# Patient Record
Sex: Male | Born: 1995
Health system: Southern US, Community
[De-identification: ages and names within clinical notes are randomized; demographics above are authoritative.]

## PROBLEM LIST (undated history)

## (undated) ENCOUNTER — Emergency Department (HOSPITAL_COMMUNITY): Payer: Medicaid Other | Source: Home / Self Care

## (undated) DIAGNOSIS — J45909 Unspecified asthma, uncomplicated: Secondary | ICD-10-CM

---

## 2014-05-31 ENCOUNTER — Encounter (HOSPITAL_BASED_OUTPATIENT_CLINIC_OR_DEPARTMENT_OTHER): Payer: Self-pay | Admitting: Emergency Medicine

## 2014-05-31 ENCOUNTER — Emergency Department (HOSPITAL_BASED_OUTPATIENT_CLINIC_OR_DEPARTMENT_OTHER)
Admission: EM | Admit: 2014-05-31 | Discharge: 2014-05-31 | Disposition: A | Discharge status: Home / Self Care | Payer: Medicaid Other | Attending: Emergency Medicine | Admitting: Emergency Medicine

## 2014-05-31 DIAGNOSIS — Y9389 Activity, other specified: Secondary | ICD-10-CM | POA: Insufficient documentation

## 2014-05-31 DIAGNOSIS — S61412A Laceration without foreign body of left hand, initial encounter: Secondary | ICD-10-CM | POA: Diagnosis present

## 2014-05-31 DIAGNOSIS — Y9289 Other specified places as the place of occurrence of the external cause: Secondary | ICD-10-CM | POA: Insufficient documentation

## 2014-05-31 DIAGNOSIS — Z720 Tobacco use: Secondary | ICD-10-CM | POA: Insufficient documentation

## 2014-05-31 DIAGNOSIS — Y998 Other external cause status: Secondary | ICD-10-CM | POA: Insufficient documentation

## 2014-05-31 DIAGNOSIS — IMO0002 Reserved for concepts with insufficient information to code with codable children: Secondary | ICD-10-CM

## 2014-05-31 DIAGNOSIS — W260XXA Contact with knife, initial encounter: Secondary | ICD-10-CM | POA: Diagnosis not present

## 2014-05-31 MED ORDER — LIDOCAINE-EPINEPHRINE (PF) 2 %-1:200000 IJ SOLN
10.0000 mL | Freq: Once | INTRAMUSCULAR | Status: AC
  Administered 2014-05-31: 10 mL
  Filled 2014-05-31: qty 20

## 2014-05-31 NOTE — ED Notes (Signed)
Pt has laceration to left hand from knife. [place] in hand and pt walked into knife

## 2014-05-31 NOTE — ED Provider Notes (Signed)
CSN: [id number]     Arrival date & time 05/31/14  2210 History  This chart was scribed for Gilda Creasehristopher J. Pollina, * by Roxy Cedarhandni Bhalodia, ED Scribe. This patient was seen in room MH09/MH09 and the patient's care was started at 10:24 PM.   Chief Complaint  Patient presents with  . Laceration   Patient is a 18 y.o. male presenting with skin laceration. The history is provided by the patient. No language interpreter was used.  Laceration  HPI Comments: Darren Finley is a 18 y.o. male who presents to the Emergency Department complaining of laceration to left hand that occurred prior to arrival when patient states to have accidentally ran into a knife in the kitchen.  History reviewed. No pertinent past medical history. History reviewed. No pertinent past surgical history. No family history on file. History  Substance Use Topics  . Smoking status: Current Every Day Smoker  . Smokeless tobacco: Not on file  . Alcohol Use: No   Review of Systems  Skin: Positive for wound.       Laceration to left hand  All other systems reviewed and are negative.  Allergies  Review of patient's allergies indicates no known allergies.  Home Medications   Prior to Admission medications   Not on File   Triage Vitals: BP 146/76 mmHg  Pulse 89  Temp(Src) 98.6 F (37 C) (Oral)  Resp 18  Ht 6\' 5"  (1.956 m)  Wt 250 lb (113.399 kg)  BMI 29.64 kg/m2  SpO2 99%  Physical Exam  Constitutional: He is oriented to person, place, and time. He appears well-developed and well-nourished. No distress.  HENT:  Head: Normocephalic and atraumatic.  Right Ear: Hearing normal.  Left Ear: Hearing normal.  Nose: Nose normal.  Mouth/Throat: Oropharynx is clear and moist and mucous membranes are normal.  Eyes: Conjunctivae and EOM are normal. Pupils are equal, round, and reactive to light.  Neck: Normal range of motion. Neck supple.  Cardiovascular: Regular rhythm, S1 normal and S2 normal.  Exam reveals no gallop  and no friction rub.   No murmur heard. Pulmonary/Chest: Effort normal and breath sounds normal. No respiratory distress. He exhibits no tenderness.  Abdominal: Soft. Normal appearance and bowel sounds are normal. There is no hepatosplenomegaly. There is no tenderness. There is no rebound, no guarding, no tenderness at McBurney's point and negative Murphy's sign. No hernia.  Musculoskeletal: Normal range of motion.  Neurological: He is alert and oriented to person, place, and time. He has normal strength. No cranial nerve deficit or sensory deficit. Coordination normal. GCS eye subscore is 4. GCS verbal subscore is 5. GCS motor subscore is 6.  Skin: Skin is warm, dry and intact. No rash noted. No cyanosis.  2cm linear laceration over the lateral aspect of thumb.  Psychiatric: He has a normal mood and affect. His speech is normal and behavior is normal. Thought content normal.  Nursing note and vitals reviewed.   ED Course  Procedures (including critical care time)  LACERATION REPAIR Performed by: Gilda CreasePOLLINA, CHRISTOPHER J. Authorized by: Gilda CreasePOLLINA, CHRISTOPHER J. Consent: Verbal consent obtained. Risks and benefits: risks, benefits and alternatives were discussed Consent given by: patient Patient identity confirmed: provided demographic data Prepped and Draped in normal sterile fashion Wound explored  Laceration Location: left hand  Laceration Length: 2cm  No Foreign Bodies seen or palpated  Anesthesia: local infiltration  Local anesthetic: lidocaine 2% with epinephrine  Anesthetic total: 2 ml  Irrigation method: syringe Amount of cleaning: standard  Skin  closure: sutures  Number of sutures: 3  Technique: simple interrupted, 3-0 prolene  Patient tolerance: Patient tolerated the procedure well with no immediate complications.   DIAGNOSTIC STUDIES: Oxygen Saturation is 99% on RA, normal by my interpretation.    COORDINATION OF CARE: 10:27 PM- Discussed plans to apply  sutures to affected area. Pt advised of plan for treatment and pt agrees.  Labs Review Labs Reviewed - No data to display  Imaging Review No results found.   EKG Interpretation None     MDM   Final diagnoses:  None  Laceration   for evaluation of laceration on the left hand at the thenar eminence. Patient has a linear laceration that is through the dermis into the subcutaneous fat, did not reach muscle. No concern for neurovascular injury. Sutures placed. Suture removal in 10 days.  I personally performed the services described in this documentation, which was scribed in my presence. The recorded information has been reviewed and is accurate.  Gilda Creasehristopher J. Pollina, MD 05/31/14 2255

## 2014-05-31 NOTE — Discharge Instructions (Signed)
Sutures need to be removed in 10 days. See student health, urgent care, primary care doctor or return to the ER.  Laceration Care, Adult A laceration is a cut or lesion that goes through all layers of the skin and into the tissue just beneath the skin. TREATMENT  Some lacerations may not require closure. Some lacerations may not be able to be closed due to an increased risk of infection. It is important to see your caregiver as soon as possible after an injury to minimize the risk of infection and maximize the opportunity for successful closure. If closure is appropriate, pain medicines may be given, if needed. The wound will be cleaned to help prevent infection. Your caregiver will use stitches (sutures), staples, wound glue (adhesive), or skin adhesive strips to repair the laceration. These tools bring the skin edges together to allow for faster healing and a better cosmetic outcome. However, all wounds will heal with a scar. Once the wound has healed, scarring can be minimized by covering the wound with sunscreen during the day for 1 full year. HOME CARE INSTRUCTIONS  For sutures or staples:  Keep the wound clean and dry.  If you were given a bandage (dressing), you should change it at least once a day. Also, change the dressing if it becomes wet or dirty, or as directed by your caregiver.  Wash the wound with soap and water 2 times a day. Rinse the wound off with water to remove all soap. Pat the wound dry with a clean towel.  After cleaning, apply a thin layer of the antibiotic ointment as recommended by your caregiver. This will help prevent infection and keep the dressing from sticking.  You may shower as usual after the first 24 hours. Do not soak the wound in water until the sutures are removed.  Only take over-the-counter or prescription medicines for pain, discomfort, or fever as directed by your caregiver.  Get your sutures or staples removed as directed by your caregiver. For skin  adhesive strips:  Keep the wound clean and dry.  Do not get the skin adhesive strips wet. You may bathe carefully, using caution to keep the wound dry.  If the wound gets wet, pat it dry with a clean towel.  Skin adhesive strips will fall off on their own. You may trim the strips as the wound heals. Do not remove skin adhesive strips that are still stuck to the wound. They will fall off in time. For wound adhesive:  You may briefly wet your wound in the shower or bath. Do not soak or scrub the wound. Do not swim. Avoid periods of heavy perspiration until the skin adhesive has fallen off on its own. After showering or bathing, gently pat the wound dry with a clean towel.  Do not apply liquid medicine, cream medicine, or ointment medicine to your wound while the skin adhesive is in place. This may loosen the film before your wound is healed.  If a dressing is placed over the wound, be careful not to apply tape directly over the skin adhesive. This may cause the adhesive to be pulled off before the wound is healed.  Avoid prolonged exposure to sunlight or tanning lamps while the skin adhesive is in place. Exposure to ultraviolet light in the first year will darken the scar.  The skin adhesive will usually remain in place for 5 to 10 days, then naturally fall off the skin. Do not pick at the adhesive film. You may need  a tetanus shot if:  You cannot remember when you had your last tetanus shot.  You have never had a tetanus shot. If you get a tetanus shot, your arm may swell, get red, and feel warm to the touch. This is common and not a problem. If you need a tetanus shot and you choose not to have one, there is a rare chance of getting tetanus. Sickness from tetanus can be serious. SEEK MEDICAL CARE IF:   You have redness, swelling, or increasing pain in the wound.  You see a red line that goes away from the wound.  You have yellowish-white fluid (pus) coming from the wound.  You have  a fever.  You notice a bad smell coming from the wound or dressing.  Your wound breaks open before or after sutures have been removed.  You notice something coming out of the wound such as wood or glass.  Your wound is on your hand or foot and you cannot move a finger or toe. SEEK IMMEDIATE MEDICAL CARE IF:   Your pain is not controlled with prescribed medicine.  You have severe swelling around the wound causing pain and numbness or a change in color in your arm, hand, leg, or foot.  Your wound splits open and starts bleeding.  You have worsening numbness, weakness, or loss of function of any joint around or beyond the wound.  You develop painful lumps near the wound or on the skin anywhere on your body. MAKE SURE YOU:   Understand these instructions.  Will watch your condition.  Will get help right away if you are not doing well or get worse. Document Released: 07/10/2005 Document Revised: 10/02/2011 Document Reviewed: 01/03/2011 [facility] Patient Information 2015 [place], [place]. This information is not intended to replace advice given to you by your health care provider. Make sure you discuss any questions you have with your health care provider.

## 2014-06-10 ENCOUNTER — Encounter (HOSPITAL_BASED_OUTPATIENT_CLINIC_OR_DEPARTMENT_OTHER): Payer: Self-pay

## 2014-06-10 ENCOUNTER — Emergency Department (HOSPITAL_BASED_OUTPATIENT_CLINIC_OR_DEPARTMENT_OTHER)
Admission: EM | Admit: 2014-06-10 | Discharge: 2014-06-10 | Disposition: A | Discharge status: Home / Self Care | Payer: Medicaid Other | Attending: Emergency Medicine | Admitting: Emergency Medicine

## 2014-06-10 DIAGNOSIS — Z87891 Personal history of nicotine dependence: Secondary | ICD-10-CM | POA: Diagnosis not present

## 2014-06-10 DIAGNOSIS — Z4802 Encounter for removal of sutures: Secondary | ICD-10-CM | POA: Diagnosis not present

## 2014-06-10 NOTE — ED Notes (Signed)
For suture removal left hand

## 2014-06-10 NOTE — ED Provider Notes (Signed)
CSN: [id number]     Arrival date & time 06/10/14  1724 History   First MD Initiated Contact with Patient 06/10/14 1733     Chief Complaint  Patient presents with  . Suture / Staple Removal     (Consider location/radiation/quality/duration/timing/severity/associated sxs/prior Treatment) The history is provided by the patient.   Darren Finley is a 18 y.o. male who presents to the ED for suture removal of the left hand. Sutures were placed 10 days ago. He states no problems other than he is having a little numbness in his thumb. He states the cut was really deep.   History reviewed. No pertinent past medical history. History reviewed. No pertinent past surgical history. No family history on file. History  Substance Use Topics  . Smoking status: Former Games developermoker  . Smokeless tobacco: Not on file  . Alcohol Use: No    Review of Systems Negative except as stated in HPI   Allergies  Review of patient's allergies indicates no known allergies.  Home Medications   Prior to Admission medications   Medication Sig Start Date End Date Taking? Authorizing Provider  Lisdexamfetamine Dimesylate (VYVANSE PO) Take by mouth.   Yes Historical Provider, MD   There were no vitals taken for this visit. Physical Exam  Constitutional: He is oriented to person, place, and time. He appears well-developed and well-nourished.  HENT:  Head: Normocephalic.  Eyes: EOM are normal.  Neck: Neck supple.  Cardiovascular: Normal rate.   Pulmonary/Chest: Effort normal.  Musculoskeletal: Normal range of motion.  Sutures in place, x3,  dorsum of right hand at base of thumb. Healing without signs of infection.   Neurological: He is alert and oriented to person, place, and time. No cranial nerve deficit.  Skin: Skin is warm and dry.  Psychiatric: He has a normal mood and affect. His behavior is normal.  Nursing note and vitals reviewed.   ED Course  Procedures  Sutures removed by me.  Wound  cleaned Steri strips applied  MDM  18 y.o. male here for suture removal. I discussed with the patient that if he continues to have numbness in his thumb that he is to call Dr. Pearletha ForgeHudnall for follow up. He voices understanding and agrees with plan.      Lauderdale LakesHope M Haitham Dolinsky, NP 06/10/14 1750  Audree CamelScott T Goldston, MD 06/17/14 [phone]

## 2014-06-10 NOTE — Discharge Instructions (Signed)

## 2014-12-31 ENCOUNTER — Emergency Department (HOSPITAL_BASED_OUTPATIENT_CLINIC_OR_DEPARTMENT_OTHER)
Admission: EM | Admit: 2014-12-31 | Discharge: 2014-12-31 | Disposition: A | Discharge status: Home / Self Care | Payer: Medicaid Other | Attending: Emergency Medicine | Admitting: Emergency Medicine

## 2014-12-31 ENCOUNTER — Encounter (HOSPITAL_BASED_OUTPATIENT_CLINIC_OR_DEPARTMENT_OTHER): Payer: Self-pay

## 2014-12-31 DIAGNOSIS — M5489 Other dorsalgia: Secondary | ICD-10-CM

## 2014-12-31 DIAGNOSIS — J45909 Unspecified asthma, uncomplicated: Secondary | ICD-10-CM | POA: Diagnosis not present

## 2014-12-31 DIAGNOSIS — R350 Frequency of micturition: Secondary | ICD-10-CM | POA: Diagnosis not present

## 2014-12-31 DIAGNOSIS — Z87891 Personal history of nicotine dependence: Secondary | ICD-10-CM | POA: Insufficient documentation

## 2014-12-31 DIAGNOSIS — R300 Dysuria: Secondary | ICD-10-CM | POA: Insufficient documentation

## 2014-12-31 DIAGNOSIS — M545 Low back pain: Secondary | ICD-10-CM | POA: Diagnosis present

## 2014-12-31 DIAGNOSIS — R3915 Urgency of urination: Secondary | ICD-10-CM | POA: Insufficient documentation

## 2014-12-31 HISTORY — DX: Unspecified asthma, uncomplicated: J45.909

## 2014-12-31 LAB — URINE MICROSCOPIC-ADD ON

## 2014-12-31 LAB — URINALYSIS, ROUTINE W REFLEX MICROSCOPIC
Bilirubin Urine: NEGATIVE
Glucose, UA: NEGATIVE mg/dL
Hgb urine dipstick: NEGATIVE
Ketones, ur: NEGATIVE mg/dL
Nitrite: NEGATIVE
PH: 7 (ref 5.0–8.0)
Protein, ur: NEGATIVE mg/dL
SPECIFIC GRAVITY, URINE: 1.025 (ref 1.005–1.030)
Urobilinogen, UA: 1 mg/dL (ref 0.0–1.0)

## 2014-12-31 MED ORDER — IBUPROFEN 800 MG PO TABS
800.0000 mg | ORAL_TABLET | Freq: Three times a day (TID) | ORAL | Status: DC | PRN
Start: 2014-12-31 — End: 2015-03-12

## 2014-12-31 MED ORDER — TRAMADOL HCL 50 MG PO TABS: 50.0000 mg | ORAL_TABLET | Freq: Four times a day (QID) | ORAL | Status: DC | PRN

## 2014-12-31 MED ORDER — IBUPROFEN 800 MG PO TABS
800.0000 mg | ORAL_TABLET | Freq: Once | ORAL | Status: AC
  Administered 2014-12-31: 800 mg via ORAL
  Filled 2014-12-31: qty 1

## 2014-12-31 NOTE — ED Notes (Signed)
C/o dysuria and lower back pain x 4 weeks

## 2014-12-31 NOTE — Discharge Instructions (Signed)

## 2014-12-31 NOTE — ED Notes (Signed)
MD at bedside. 

## 2014-12-31 NOTE — ED Provider Notes (Signed)
TIME SEEN: 4:30 PM  CHIEF COMPLAINT: Lower back pain, dysuria, urinary frequency  HPI: Pt is a 19 y.o. male with history of asthma who presents to the emergency department with 4 weeks of dysuria, urinary frequency and urgency. One week later he developed lower back pain. Reports the back pain radiates down his left leg. Worse with movement, bending over and standing for long periods of time. States he did research on the Internet and thinks he may have a urinary tract infection. Has never had a UTI before. Denies penile discharge. No testicular pain or swelling. No fevers, nausea, vomiting or diarrhea. No numbness, tingling or focal weakness. No bowel or bladder incontinence. No urinary retention. No known history of injury to the back but does workout regularly. Is sexually active with one male partner and uses condoms. Denies any history of STDs previously.  ROS: See HPI Constitutional: no fever  Eyes: no drainage  ENT: no runny nose   Cardiovascular:  no chest pain  Resp: no SOB  GI: no vomiting GU:  dysuria Integumentary: no rash  Allergy: no hives  Musculoskeletal: no leg swelling  Neurological: no slurred speech ROS otherwise negative  PAST MEDICAL HISTORY/PAST SURGICAL HISTORY:  Past Medical History  Diagnosis Date  . Asthma     MEDICATIONS:  Prior to Admission medications   Not on File    ALLERGIES:  No Known Allergies  SOCIAL HISTORY:  History  Substance Use Topics  . Smoking status: Former Games developer  . Smokeless tobacco: Not on file  . Alcohol Use: No    FAMILY HISTORY: No family history on file.  EXAM: BP 130/87 mmHg  Pulse 92  Temp(Src) 98.4 F (36.9 C) (Oral)  Resp 18  Ht  (1.956 m)  Wt 255 lb (115.667 kg)  BMI 30.23 kg/m2  SpO2 98% CONSTITUTIONAL: Alert and oriented and responds appropriately to questions. Well-appearing; well-nourished, nontoxic, smiling, pleasant, afebrile, in no distress  HEAD: Normocephalic EYES: Conjunctivae clear,  PERRL ENT: normal nose; no rhinorrhea; moist mucous membranes; pharynx without lesions noted NECK: Supple, no meningismus, no LAD  CARD: RRR; S1 and S2 appreciated; no murmurs, no clicks, no rubs, no gallops RESP: Normal chest excursion without splinting or tachypnea; breath sounds clear and equal bilaterally; no wheezes, no rhonchi, no rales, no hypoxia or respiratory distress, speaking full sentences ABD/GI: Normal bowel sounds; non-distended; soft, non-tender, no rebound, no guarding, no peritoneal signs GU:  Normal external genitalia, circumcised male, normal urethral meatus without blood or discharge, no testicular tenderness or masses, no scrotal masses, 2+ femoral pulses bilaterally, no hernia appreciated BACK:  The back appears normal and is non-tender to palpation, there is no CVA tenderness, no midline spinal tenderness or step-off or deformity EXT: Normal ROM in all joints; non-tender to palpation; no edema; normal capillary refill; no cyanosis, no calf tenderness or swelling    SKIN: Normal color for age and race; warm NEURO: Moves all extremities equally, sensation to light touch intact diffusely, cranial nerves II through XII intact, normal gait PSYCH: The patient's mood and manner are appropriate. Grooming and personal hygiene are appropriate.  MEDICAL DECISION MAKING: Patient here with urinary symptoms and lower back pain. Urine today shows trace leukocytes but no hematuria or other sign of infection. Gonorrhea and chlamydia pending. He has not tried any medication at home for his pain. Pain is likely musculoskeletal in nature given his worse with movement, stretching. He may have some symptoms of radiculopathy given intermittent pain down his left leg  but none currently. We'll discharge him with ibuprofen and short prescription for tramadol if that is not enough to control his pain. Have advised rest, alternating ice and heat for pain control. Discussed return precautions. He has no red  psych symptoms that make me think that he needs emergent imaging today. Have her committed close outpatient follow-up if symptoms continue despite medical management. He verbalizes understanding and is comfortable with plan.      Layla Maw Ward, DO 12/31/14 1713

## 2015-01-01 LAB — GC/CHLAMYDIA PROBE AMP (~~LOC~~) NOT AT ARMC
Chlamydia: POSITIVE — AB
Neisseria Gonorrhea: NEGATIVE

## 2015-01-04 ENCOUNTER — Telehealth (HOSPITAL_COMMUNITY): Payer: Self-pay

## 2015-01-04 NOTE — ED Notes (Signed)
Positive for chlamydia. Chart sent to EDP office for review.  

## 2015-01-05 ENCOUNTER — Telehealth: Payer: Self-pay | Admitting: Emergency Medicine

## 2015-01-05 NOTE — Telephone Encounter (Signed)
Post ED Visit - Positive Culture Follow-up: Successful Patient Follow-Up  Positive Chlamydia culture  [x]  Patient discharged without antimicrobial prescription and treatment is now indicated []  Organism is resistant to prescribed ED discharge antimicrobial []  Patient with positive blood cultures  Changes discussed with ED provider: Trixie Dredge PA New antibiotic prescription: Azithromycin  One gram PO x once Called to [facility] [phone]  Contacted patient, date 01/05/15 , time 1400  ID verified, patient notified of positive Chlamydia and need for treatment. RX Azithromycin called to [facility] [phone]. STD instructions provided, patient verbalized understanding.  Jiles Harold 01/05/2015, 2:06 PM

## 2015-03-12 ENCOUNTER — Emergency Department (HOSPITAL_BASED_OUTPATIENT_CLINIC_OR_DEPARTMENT_OTHER)
Admission: EM | Admit: 2015-03-12 | Discharge: 2015-03-12 | Disposition: A | Discharge status: Home / Self Care | Payer: Medicaid Other | Attending: Emergency Medicine | Admitting: Emergency Medicine

## 2015-03-12 ENCOUNTER — Encounter (HOSPITAL_BASED_OUTPATIENT_CLINIC_OR_DEPARTMENT_OTHER): Payer: Self-pay | Admitting: *Deleted

## 2015-03-12 DIAGNOSIS — R062 Wheezing: Secondary | ICD-10-CM | POA: Diagnosis present

## 2015-03-12 DIAGNOSIS — Z113 Encounter for screening for infections with a predominantly sexual mode of transmission: Secondary | ICD-10-CM | POA: Diagnosis not present

## 2015-03-12 DIAGNOSIS — Z87891 Personal history of nicotine dependence: Secondary | ICD-10-CM | POA: Insufficient documentation

## 2015-03-12 DIAGNOSIS — J4521 Mild intermittent asthma with (acute) exacerbation: Secondary | ICD-10-CM | POA: Diagnosis not present

## 2015-03-12 DIAGNOSIS — J4520 Mild intermittent asthma, uncomplicated: Secondary | ICD-10-CM

## 2015-03-12 LAB — URINE MICROSCOPIC-ADD ON

## 2015-03-12 LAB — URINALYSIS, ROUTINE W REFLEX MICROSCOPIC
Bilirubin Urine: NEGATIVE
GLUCOSE, UA: NEGATIVE mg/dL
HGB URINE DIPSTICK: NEGATIVE
Ketones, ur: NEGATIVE mg/dL
Nitrite: NEGATIVE
PH: 7.5 (ref 5.0–8.0)
Protein, ur: NEGATIVE mg/dL
SPECIFIC GRAVITY, URINE: 1.017 (ref 1.005–1.030)
Urobilinogen, UA: 1 mg/dL (ref 0.0–1.0)

## 2015-03-12 MED ORDER — CEFTRIAXONE SODIUM 250 MG IJ SOLR
250.0000 mg | Freq: Once | INTRAMUSCULAR | Status: AC
  Administered 2015-03-12: 250 mg via INTRAMUSCULAR
  Filled 2015-03-12: qty 250

## 2015-03-12 MED ORDER — ALBUTEROL SULFATE (2.5 MG/3ML) 0.083% IN NEBU
5.0000 mg | INHALATION_SOLUTION | Freq: Once | RESPIRATORY_TRACT | Status: AC
  Administered 2015-03-12: 5 mg via RESPIRATORY_TRACT
  Filled 2015-03-12: qty 6

## 2015-03-12 MED ORDER — AZITHROMYCIN 250 MG PO TABS
1000.0000 mg | ORAL_TABLET | Freq: Once | ORAL | Status: AC
  Administered 2015-03-12: 1000 mg via ORAL
  Filled 2015-03-12: qty 4

## 2015-03-12 MED ORDER — ALBUTEROL SULFATE HFA 108 (90 BASE) MCG/ACT IN AERS
1.0000 | INHALATION_SPRAY | RESPIRATORY_TRACT | Status: DC | PRN
  Administered 2015-03-12: 1 via RESPIRATORY_TRACT
  Filled 2015-03-12: qty 6.7

## 2015-03-12 NOTE — ED Provider Notes (Signed)
CSN: [id number]     Arrival date & time 03/12/15  1804 History   First MD Initiated Contact with Patient 03/12/15 1814     Chief Complaint  Patient presents with  . Wheezing     (Consider location/radiation/quality/duration/timing/severity/associated sxs/prior Treatment) Patient is a 19 y.o. male presenting with wheezing. The history is provided by the patient and medical records.  Wheezing Associated symptoms: chest tightness and shortness of breath      18 y.o. M with hx of asthma, presenting to the ED for wheezing for the past 3 days. Patient denies known trigger exposure. He states symptoms have been intermittent.  He reports some chest tightness and sensation of shortness of breath. He denies any cough, nasal congestion, or other upper respiratory symptoms. No fever or chills. Patient does not have any medications at home for his asthma.  Past Medical History  Diagnosis Date  . Asthma    History reviewed. No pertinent past surgical history. History reviewed. No pertinent family history. Social History  Substance Use Topics  . Smoking status: Former Games developer  . Smokeless tobacco: None  . Alcohol Use: No    Review of Systems  Respiratory: Positive for chest tightness, shortness of breath and wheezing.   All other systems reviewed and are negative.     Allergies  Review of patient's allergies indicates no known allergies.  Home Medications   Prior to Admission medications   Not on File   BP 150/66 mmHg  Pulse 88  Temp(Src) 98.2 F (36.8 C) (Oral)  Resp 16  Ht 6\' 5"  (1.956 m)  Wt 250 lb (113.399 kg)  BMI 29.64 kg/m2  SpO2 100%   Physical Exam  Constitutional: He is oriented to person, place, and time. He appears well-developed and well-nourished. No distress.  HENT:  Head: Normocephalic and atraumatic.  Mouth/Throat: Oropharynx is clear and moist.  Eyes: Conjunctivae and EOM are normal. Pupils are equal, round, and reactive to light.  Neck: Normal range of  motion.  Cardiovascular: Normal rate, regular rhythm and normal heart sounds.   Pulmonary/Chest: Effort normal and breath sounds normal. No respiratory distress. He has no wheezes. He has no rhonchi. He has no rales.  Respirations unlabored, lungs clear bilaterally, speaking in full complete sentences without difficulty; O2 sats 100% on room air  Abdominal: Soft. Bowel sounds are normal. There is no tenderness. There is no guarding.  Musculoskeletal: Normal range of motion.  Neurological: He is alert and oriented to person, place, and time.  Skin: Skin is warm and dry. He is not diaphoretic.  Psychiatric: He has a normal mood and affect.  Nursing note and vitals reviewed.   ED Course  Procedures (including critical care time) Labs Review Labs Reviewed  URINALYSIS, ROUTINE W REFLEX MICROSCOPIC (NOT AT [facility]) - Abnormal; Notable for the following:    Leukocytes, UA SMALL (*)    All other components within normal limits  URINE MICROSCOPIC-ADD ON  GC/CHLAMYDIA PROBE AMP ([place]) NOT AT [facility]    Imaging Review No results found.    EKG Interpretation None      MDM   Final diagnoses:  Asthma, mild intermittent, uncomplicated  Screening for STD (sexually transmitted disease)   19 year old male here with wheezing. His vital signs are stable on room air. He is in no acute respiratory distress. His lungs are completely clear bilaterally and he is speaking in full complete sentences without difficulty. Patient still complains of some shortness of breath. Nebulizer treatment was given as well  as an albuterol inhaler. Patient informed respiratory therapist that he has concerns for recurrent STD and would like to be tested. He denies any current dysuria or penile discharge. He was diagnosed with chlamydia 2 months ago. He has had recent unprotected sex with new sexual partner. UA with small leukocytes, otherwise normal. Gc/chl pending.  Patient treated empirically with Rocephin and  azithromycin. He was instructed that he will be notified in the next 24-48 hrs if culture results are positive.  FU with health dept for further STD needs.  Discussed plan with patient, he/she acknowledged understanding and agreed with plan of care.  Return precautions given for new or worsening symptoms.  Garlon Hatchet, PA-C 03/12/15 2122  Rolan Bucco, MD 03/12/15 2130

## 2015-03-12 NOTE — ED Notes (Signed)
Pt reports wheezing from asthma x 2-3 days.  No respiratory distress noted.

## 2015-03-12 NOTE — Discharge Instructions (Signed)
He is inhaler as needed for shortness of breath or wheezing. Follow-up with the health department for further STD needs. Return here for new concerns.

## 2015-03-15 LAB — GC/CHLAMYDIA PROBE AMP (~~LOC~~) NOT AT ARMC
Chlamydia: POSITIVE — AB
Neisseria Gonorrhea: NEGATIVE

## 2015-03-16 ENCOUNTER — Telehealth (HOSPITAL_COMMUNITY): Payer: Self-pay | Admitting: Emergency Medicine

## 2015-03-16 NOTE — Telephone Encounter (Signed)
pt returned call, ID verified x three. Patient notified of positive Chlamydia and that treatment was given while in ED. STD instructions provided, patient verbalized understanding.

## 2015-03-16 NOTE — Telephone Encounter (Signed)
Positive Chlamydia culture Treated with Rocephin and Zithromax per protocol MD [facility] faxed  Will contact patient. 

## 2015-11-09 ENCOUNTER — Emergency Department (HOSPITAL_BASED_OUTPATIENT_CLINIC_OR_DEPARTMENT_OTHER)
Admission: EM | Admit: 2015-11-09 | Discharge: 2015-11-09 | Disposition: A | Discharge status: Home / Self Care | Payer: Medicaid Other | Attending: Emergency Medicine | Admitting: Emergency Medicine

## 2015-11-09 ENCOUNTER — Encounter (HOSPITAL_BASED_OUTPATIENT_CLINIC_OR_DEPARTMENT_OTHER): Payer: Self-pay

## 2015-11-09 ENCOUNTER — Emergency Department (HOSPITAL_BASED_OUTPATIENT_CLINIC_OR_DEPARTMENT_OTHER): Payer: Medicaid Other

## 2015-11-09 DIAGNOSIS — F17200 Nicotine dependence, unspecified, uncomplicated: Secondary | ICD-10-CM | POA: Diagnosis not present

## 2015-11-09 DIAGNOSIS — R062 Wheezing: Secondary | ICD-10-CM | POA: Diagnosis present

## 2015-11-09 DIAGNOSIS — J45901 Unspecified asthma with (acute) exacerbation: Secondary | ICD-10-CM

## 2015-11-09 DIAGNOSIS — Z720 Tobacco use: Secondary | ICD-10-CM

## 2015-11-09 MED ORDER — IPRATROPIUM-ALBUTEROL 0.5-2.5 (3) MG/3ML IN SOLN
3.0000 mL | Freq: Once | RESPIRATORY_TRACT | Status: AC
  Administered 2015-11-09: 3 mL via RESPIRATORY_TRACT
  Filled 2015-11-09: qty 3

## 2015-11-09 MED ORDER — ALBUTEROL SULFATE (2.5 MG/3ML) 0.083% IN NEBU
5.0000 mg | INHALATION_SOLUTION | Freq: Once | RESPIRATORY_TRACT | Status: AC
  Administered 2015-11-09: 5 mg via RESPIRATORY_TRACT
  Filled 2015-11-09: qty 6

## 2015-11-09 MED ORDER — ALBUTEROL SULFATE HFA 108 (90 BASE) MCG/ACT IN AERS: 1.0000 | INHALATION_SPRAY | RESPIRATORY_TRACT | Status: DC | PRN

## 2015-11-09 MED ORDER — PREDNISONE 10 MG (21) PO TBPK: 10.0000 mg | ORAL_TABLET | Freq: Every day | ORAL | Status: DC

## 2015-11-09 MED ORDER — ALBUTEROL SULFATE HFA 108 (90 BASE) MCG/ACT IN AERS: 1.0000 | INHALATION_SPRAY | Freq: Four times a day (QID) | RESPIRATORY_TRACT | Status: DC | PRN

## 2015-11-09 MED ORDER — ALBUTEROL SULFATE (2.5 MG/3ML) 0.083% IN NEBU
2.5000 mg | INHALATION_SOLUTION | Freq: Once | RESPIRATORY_TRACT | Status: AC
  Administered 2015-11-09: 2.5 mg via RESPIRATORY_TRACT
  Filled 2015-11-09: qty 3

## 2015-11-09 MED ORDER — PREDNISONE 10 MG PO TABS
60.0000 mg | ORAL_TABLET | Freq: Once | ORAL | Status: AC
  Administered 2015-11-09: 60 mg via ORAL
  Filled 2015-11-09: qty 1

## 2015-11-09 MED FILL — predniSONE 10 MG TABS: 10 | 12 days supply | Qty: 42 | Fill #0

## 2015-11-09 MED FILL — PROVENTIL HFA 90 MCG INH: 108 (90 BAS | 30 days supply | Qty: 7 | Fill #0

## 2015-11-09 NOTE — ED Provider Notes (Signed)
CSN: [id number]     Arrival date & time 11/09/15  1210 History   First MD Initiated Contact with Patient 11/09/15 1213     Chief Complaint  Patient presents with  . Wheezing     (Consider location/radiation/quality/duration/timing/severity/associated sxs/prior Treatment) HPI   PT HAS A HX OF ASTHMA AND HAS HAD COLD SX FOR THE PAST FEW DAYS.  THE PT IS OUT OF HIS INHALER.  HE QUIT SMOKING 3 DAYS AGO.  Past Medical History  Diagnosis Date  . Asthma    History reviewed. No pertinent past surgical history. No family history on file. Social History  Substance Use Topics  . Smoking status: Former Smoker    Quit date: 11/06/2015  . Smokeless tobacco: None  . Alcohol Use: No    Review of Systems  HENT: Positive for congestion.   Respiratory: Positive for cough, shortness of breath and wheezing.   All other systems reviewed and are negative.     Allergies  Review of patient's allergies indicates no known allergies.  Home Medications   Prior to Admission medications   Not on File   BP 125/75 mmHg  Pulse 67  Temp(Src) 97.9 F (36.6 C) (Oral)  Resp 20  Ht 6\' 5"  (1.956 m)  Wt 255 lb (115.667 kg)  BMI 30.23 kg/m2  SpO2 100% Physical Exam  Constitutional: He is oriented to person, place, and time. He appears well-developed and well-nourished.  HENT:  Head: Normocephalic and atraumatic.  Mouth/Throat: Oropharynx is clear and moist.  Eyes: Conjunctivae and EOM are normal. Pupils are equal, round, and reactive to light.  Neck: Normal range of motion. Neck supple.  Cardiovascular: Normal rate, regular rhythm, normal heart sounds and intact distal pulses.   Pulmonary/Chest: He has wheezes.  Abdominal: Soft. Bowel sounds are normal.  Musculoskeletal: Normal range of motion.  Neurological: He is alert and oriented to person, place, and time.  Skin: Skin is warm and dry.  Psychiatric: He has a normal mood and affect.  Nursing note and vitals reviewed.   ED Course   Procedures (including critical care time) Labs Review Labs Reviewed - No data to display  Imaging Review No results found. I have personally reviewed and evaluated these images and lab results as part of my medical decision-making.   EKG Interpretation None      MDM  IMPROVED  FINAL DX: ACUTE ASTHMA EXACERBATION TOBACCO ABUSE      Jacalyn LefevreJulie Mearle Drew, MD 11/09/15 1340

## 2015-11-09 NOTE — ED Notes (Signed)
C/o wheezing, sob x 1 week-NAD

## 2015-11-09 NOTE — Discharge Instructions (Signed)
Asthma, Adult  CONTINUE TO NOT SMOKE  Asthma is a condition of the lungs in which the airways tighten and narrow. Asthma can make it hard to breathe. Asthma cannot be cured, but medicine and lifestyle changes can help control it. Asthma may be started (triggered) by:  Animal skin flakes (dander).  Dust.  Cockroaches.  Pollen.  Mold.  Smoke.  Cleaning products.  Hair sprays or aerosol sprays.  Paint fumes or strong smells.  Cold air, weather changes, and winds.  Crying or laughing hard.  Stress.  Certain medicines or drugs.  Foods, such as dried fruit, potato chips, and sparkling grape juice.  Infections or conditions (colds, flu).  Exercise.  Certain medical conditions or diseases.  Exercise or tiring activities. HOME CARE   Take medicine as told by your doctor.  Use a peak flow meter as told by your doctor. A peak flow meter is a tool that measures how well the lungs are working.  Record and keep track of the peak flow meter's readings.  Understand and use the asthma action plan. An asthma action plan is a written plan for taking care of your asthma and treating your attacks.  To help prevent asthma attacks:  Do not smoke. Stay away from secondhand smoke.  Change your heating and air conditioning filter often.  Limit your use of fireplaces and wood stoves.  Get rid of pests (such as roaches and mice) and their droppings.  Throw away plants if you see mold on them.  Clean your floors. Dust regularly. Use cleaning products that do not smell.  Have someone vacuum when you are not home. Use a vacuum cleaner with a HEPA filter if possible.  Replace carpet with wood, tile, or vinyl flooring. Carpet can trap animal skin flakes and dust.  Use allergy-proof pillows, mattress covers, and box spring covers.  Wash bed sheets and blankets every week in hot water and dry them in a dryer.  Use blankets that are made of polyester or cotton.  Clean bathrooms  and kitchens with bleach. If possible, have someone repaint the walls in these rooms with mold-resistant paint. Keep out of the rooms that are being cleaned and painted.  Wash hands often. GET HELP IF:  You have make a whistling sound when breaking (wheeze), have shortness of breath, or have a cough even if taking medicine to prevent attacks.  The colored mucus you cough up (sputum) is thicker than usual.  The colored mucus you cough up changes from clear or white to yellow, green, gray, or bloody.  You have problems from the medicine you are taking such as:  A rash.  Itching.  Swelling.  Trouble breathing.  You need reliever medicines more than 2-3 times a week.  Your peak flow measurement is still at 50-79% of your personal best after following the action plan for 1 hour.  You have a fever. GET HELP RIGHT AWAY IF:   You seem to be worse and are not responding to medicine during an asthma attack.  You are short of breath even at rest.  You get short of breath when doing very little activity.  You have trouble eating, drinking, or talking.  You have chest pain.  You have a fast heartbeat.  Your lips or fingernails start to turn blue.  You are light-headed, dizzy, or faint.  Your peak flow is less than 50% of your personal best.   This information is not intended to replace advice given to you by  your health care provider. Make sure you discuss any questions you have with your health care provider.   Document Released: 12/27/2007 Document Revised: 03/31/2015 Document Reviewed: 02/06/2013 Elsevier Interactive Patient Education [employer]

## 2015-12-03 ENCOUNTER — Emergency Department (HOSPITAL_BASED_OUTPATIENT_CLINIC_OR_DEPARTMENT_OTHER): Payer: Medicaid Other

## 2015-12-03 ENCOUNTER — Encounter (HOSPITAL_BASED_OUTPATIENT_CLINIC_OR_DEPARTMENT_OTHER): Payer: Self-pay | Admitting: Emergency Medicine

## 2015-12-03 ENCOUNTER — Emergency Department (HOSPITAL_BASED_OUTPATIENT_CLINIC_OR_DEPARTMENT_OTHER)
Admission: EM | Admit: 2015-12-03 | Discharge: 2015-12-04 | Disposition: A | Discharge status: Home / Self Care | Payer: Medicaid Other | Attending: Emergency Medicine | Admitting: Emergency Medicine

## 2015-12-03 DIAGNOSIS — J45901 Unspecified asthma with (acute) exacerbation: Secondary | ICD-10-CM

## 2015-12-03 DIAGNOSIS — R0602 Shortness of breath: Secondary | ICD-10-CM | POA: Diagnosis present

## 2015-12-03 DIAGNOSIS — Z87891 Personal history of nicotine dependence: Secondary | ICD-10-CM | POA: Diagnosis not present

## 2015-12-03 DIAGNOSIS — R062 Wheezing: Secondary | ICD-10-CM

## 2015-12-03 MED ORDER — ALBUTEROL SULFATE (2.5 MG/3ML) 0.083% IN NEBU
2.5000 mg | INHALATION_SOLUTION | Freq: Once | RESPIRATORY_TRACT | Status: AC
Start: 2015-12-03 — End: 2015-12-03
  Administered 2015-12-03: 2.5 mg via RESPIRATORY_TRACT
  Filled 2015-12-03: qty 3

## 2015-12-03 MED ORDER — ALBUTEROL (5 MG/ML) CONTINUOUS INHALATION SOLN
10.0000 mg/h | INHALATION_SOLUTION | RESPIRATORY_TRACT | Status: DC
  Administered 2015-12-03: 10 mg/h via RESPIRATORY_TRACT
  Filled 2015-12-03: qty 20

## 2015-12-03 MED ORDER — METHYLPREDNISOLONE SODIUM SUCC 125 MG IJ SOLR
125.0000 mg | Freq: Once | INTRAMUSCULAR | Status: AC
  Administered 2015-12-04: 125 mg via INTRAVENOUS
  Filled 2015-12-03: qty 2

## 2015-12-03 MED ORDER — IPRATROPIUM-ALBUTEROL 0.5-2.5 (3) MG/3ML IN SOLN
3.0000 mL | Freq: Once | RESPIRATORY_TRACT | Status: AC
  Administered 2015-12-03: 3 mL via RESPIRATORY_TRACT
  Filled 2015-12-03: qty 3

## 2015-12-03 NOTE — ED Notes (Signed)
Patient states that he has had SOB for 2 days. The patient was told by PCP to continue his prednisone and use n haler when he saw her this am

## 2015-12-04 MED ORDER — PREDNISONE 10 MG PO TABS: 20.0000 mg | ORAL_TABLET | Freq: Every day | ORAL | Status: DC

## 2015-12-04 MED ORDER — ALBUTEROL (5 MG/ML) CONTINUOUS INHALATION SOLN
20.0000 mg/h | INHALATION_SOLUTION | RESPIRATORY_TRACT | Status: DC
  Administered 2015-12-04: 20 mg/h via RESPIRATORY_TRACT

## 2015-12-04 MED ORDER — ALBUTEROL SULFATE HFA 108 (90 BASE) MCG/ACT IN AERS: 2.0000 | INHALATION_SPRAY | RESPIRATORY_TRACT | Status: DC | PRN

## 2015-12-04 MED ORDER — IPRATROPIUM BROMIDE 0.02 % IN SOLN
1.0000 mg | Freq: Once | RESPIRATORY_TRACT | Status: AC
  Administered 2015-12-04: 1 mg via RESPIRATORY_TRACT
  Filled 2015-12-04: qty 5

## 2015-12-04 MED ORDER — MAGNESIUM SULFATE 50 % IJ SOLN
2.0000 g | Freq: Once | INTRAMUSCULAR | Status: AC
  Administered 2015-12-04: 2 g via INTRAVENOUS
  Filled 2015-12-04: qty 4

## 2015-12-04 NOTE — ED Provider Notes (Signed)
CSN: [id number]     Arrival date & time 12/03/15  2311 History   First MD Initiated Contact with Patient 12/03/15 2329     Chief Complaint  Patient presents with  . Shortness of Breath    (Consider location/radiation/quality/duration/timing/severity/associated sxs/prior Treatment) Patient is a 20 y.o. male presenting with shortness of breath. The history is provided by the patient and medical records. No language interpreter was used.  Shortness of Breath Associated symptoms: cough and wheezing   Associated symptoms: no abdominal pain, no fever, no headaches and no rash    Zachary GeorgeMohamed Sutcliffe is a 20 y.o. male  with a PMH of asthma who presents to the Emergency Department complaining of shortness of breath which began suddenly upon awakening this morning. Home inhalers used at home with minimal relief. He was seen by his primary physician this afternoon around 4:00. He was given a breathing treatment, stating it improved his symptoms temporarily, and sent home. He was told to take prednisone that he had left over at home, however he did not take this prior to arrival today. Patient states symptoms worsened, and he felt like he was unable to take a big breath, therefore he came to the ED today. Denies chest pain, fever. Has been experiencing cold symptoms over the last few days as well. Patient states he does not have asthma exacerbations very often and this is much worse than exacerbations he has experienced in the past. Hospitalized as a child for asthma "maybe once" Seen in ED for similar on 4/18 and was non-compliant with meds.    Past Medical History  Diagnosis Date  . Asthma    History reviewed. No pertinent past surgical history. History reviewed. No pertinent family history. Social History  Substance Use Topics  . Smoking status: Former Smoker    Quit date: 11/06/2015  . Smokeless tobacco: None  . Alcohol Use: No    Review of Systems  Constitutional: Negative for fever.  HENT:  Positive for congestion.   Eyes: Negative for visual disturbance.  Respiratory: Positive for cough, shortness of breath and wheezing.   Cardiovascular: Negative.   Gastrointestinal: Negative for abdominal pain.  Musculoskeletal: Negative for back pain.  Skin: Negative for rash.  Allergic/Immunologic: Negative for immunocompromised state.  Neurological: Negative for headaches.      Allergies  Review of patient's allergies indicates no known allergies.  Home Medications   Prior to Admission medications   Medication Sig Start Date End Date Taking? Authorizing Provider  albuterol (PROVENTIL HFA;VENTOLIN HFA) 108 (90 Base) MCG/ACT inhaler Inhale 1-2 puffs into the lungs every 6 (six) hours as needed for wheezing or shortness of breath. 11/09/15   Jacalyn LefevreJulie Haviland, MD  predniSONE (STERAPRED UNI-PAK 21 TAB) 10 MG (21) TBPK tablet Take 1 tablet (10 mg total) by mouth daily. Take 6 tabs by mouth daily  for 2 days, then 5 tabs for 2 days, then 4 tabs for 2 days, then 3 tabs for 2 days, 2 tabs for 2 days, then 1 tab by mouth daily for 2 days 11/09/15   Jacalyn LefevreJulie Haviland, MD   BP 128/72 mmHg  Pulse 93  Temp(Src) 98.1 F (36.7 C) (Oral)  Resp 28  Wt 115.667 kg  SpO2 97% Physical Exam  Constitutional: He is oriented to person, place, and time. He appears well-developed and well-nourished.  HENT:  Head: Normocephalic and atraumatic.  Cardiovascular: Normal rate, regular rhythm and normal heart sounds.   No murmur heard. Pulmonary/Chest:  Increased effort of breathing, prolonged expiratory  phase with wheezing in all lung fields.  Abdominal: Soft. He exhibits no distension. There is no tenderness.  Musculoskeletal: He exhibits no edema.  Neurological: He is alert and oriented to person, place, and time.  Skin: Skin is warm and dry.  Nursing note and vitals reviewed.   ED Course  Procedures (including critical care time) Labs Review Labs Reviewed - No data to display  Imaging Review Dg  Chest 2 View  12/04/2015  CLINICAL DATA:  Dyspnea for 2 days EXAM: CHEST  2 VIEW COMPARISON:  [place] 17 FINDINGS: The lungs are clear. The pulmonary vasculature is normal. Heart size is normal. Hilar and mediastinal contours are unremarkable. There is no pleural effusion. IMPRESSION: No active cardiopulmonary disease. Electronically Signed   By: Ellery Plunk M.D.   On: 12/04/2015 00:03   I have personally reviewed and evaluated these images and lab results as part of my medical decision-making.   EKG Interpretation None      MDM   Final diagnoses:  Wheezing   Riely Baskett presents to ED for asthma exacerbation. DuoNeb given upon arrival. Patient placed on continuous neb and treated with Solu-Medrol and mag. Chest x-ray with no cute cardiopulmonary disease. Patient still receiving neb treatment at shift change. Care assumed by oncoming provider, Dr. Elesa Massed, case discussed, plan agreed upon. Will continue to monitor and dispo appropriately.   Filed Vitals:   12/03/15 2320  BP: 128/72  Pulse: 93  Temp: 98.1 F (36.7 C)  Resp: [id number] Quintarius Ferns, PA-C 12/04/15 0119  Layla Maw Venise Ellingwood, DO 12/04/15 [phone]

## 2015-12-04 NOTE — Discharge Instructions (Signed)

## 2016-06-12 ENCOUNTER — Emergency Department (HOSPITAL_BASED_OUTPATIENT_CLINIC_OR_DEPARTMENT_OTHER)
Admission: EM | Admit: 2016-06-12 | Discharge: 2016-06-12 | Disposition: A | Discharge status: Home / Self Care | Payer: Medicaid Other | Attending: Emergency Medicine | Admitting: Emergency Medicine

## 2016-06-12 ENCOUNTER — Encounter (HOSPITAL_BASED_OUTPATIENT_CLINIC_OR_DEPARTMENT_OTHER): Payer: Self-pay | Admitting: *Deleted

## 2016-06-12 ENCOUNTER — Emergency Department (HOSPITAL_BASED_OUTPATIENT_CLINIC_OR_DEPARTMENT_OTHER): Payer: Medicaid Other

## 2016-06-12 DIAGNOSIS — J189 Pneumonia, unspecified organism: Secondary | ICD-10-CM | POA: Diagnosis not present

## 2016-06-12 DIAGNOSIS — J4521 Mild intermittent asthma with (acute) exacerbation: Secondary | ICD-10-CM | POA: Diagnosis not present

## 2016-06-12 DIAGNOSIS — R0602 Shortness of breath: Secondary | ICD-10-CM | POA: Diagnosis present

## 2016-06-12 DIAGNOSIS — Z87891 Personal history of nicotine dependence: Secondary | ICD-10-CM | POA: Insufficient documentation

## 2016-06-12 LAB — RAPID STREP SCREEN (MED CTR MEBANE ONLY): Streptococcus, Group A Screen (Direct): NEGATIVE

## 2016-06-12 MED ORDER — ALBUTEROL SULFATE (2.5 MG/3ML) 0.083% IN NEBU
5.0000 mg | INHALATION_SOLUTION | Freq: Once | RESPIRATORY_TRACT | Status: AC
  Administered 2016-06-12: 5 mg via RESPIRATORY_TRACT
  Filled 2016-06-12: qty 6

## 2016-06-12 MED ORDER — ALBUTEROL SULFATE HFA 108 (90 BASE) MCG/ACT IN AERS: 1.0000 | INHALATION_SPRAY | Freq: Four times a day (QID) | RESPIRATORY_TRACT | 0 refills | Status: DC | PRN

## 2016-06-12 MED ORDER — PREDNISONE 20 MG PO TABS: ORAL_TABLET | ORAL | 0 refills | Status: DC

## 2016-06-12 MED ORDER — IPRATROPIUM-ALBUTEROL 0.5-2.5 (3) MG/3ML IN SOLN
RESPIRATORY_TRACT | Status: AC
  Administered 2016-06-12: 3 mL
  Filled 2016-06-12: qty 3

## 2016-06-12 MED ORDER — PREDNISONE 50 MG PO TABS
60.0000 mg | ORAL_TABLET | Freq: Once | ORAL | Status: AC
  Administered 2016-06-12: 60 mg via ORAL
  Filled 2016-06-12: qty 1

## 2016-06-12 MED ORDER — AZITHROMYCIN 250 MG PO TABS: ORAL_TABLET | ORAL | 0 refills | Status: DC

## 2016-06-12 MED ORDER — ALBUTEROL SULFATE (2.5 MG/3ML) 0.083% IN NEBU
INHALATION_SOLUTION | RESPIRATORY_TRACT | Status: AC
Start: 2016-06-12 — End: 2016-06-12
  Administered 2016-06-12: 2.5 mg
  Filled 2016-06-12: qty 3

## 2016-06-12 MED FILL — AZITHROMYCIN 250 MG TABLET: 250 | 5 days supply | Qty: 6 | Fill #0

## 2016-06-12 MED FILL — predniSONE 20 MG TABS: 20 | 6 days supply | Qty: 12 | Fill #0

## 2016-06-12 NOTE — ED Provider Notes (Signed)
[facility]   CSN: [id number] Arrival date & time: 06/12/16  1058     History   Chief Complaint Chief Complaint  Patient presents with  . Shortness of Breath    HPI Darren Finley is a 20 y.o. male hx of asthma here with cough, wheezing. Patient has coughing for the last 2 weeks. Has been using albuterol with minimal relief. Has sinus congestion and runny nose and sore throat as well. Denies fevers. Patient ran out of his albuterol yesterday. Has seasonal allergies and exacerbations several times a year. No recent admissions.   The history is provided by the patient.    Past Medical History:  Diagnosis Date  . Asthma     There are no active problems to display for this patient.   History reviewed. No pertinent surgical history.     Home Medications    Prior to Admission medications   Medication Sig Start Date End Date Taking? Authorizing Provider  albuterol (PROVENTIL HFA;VENTOLIN HFA) 108 (90 Base) MCG/ACT inhaler Inhale 1-2 puffs into the lungs every 6 (six) hours as needed for wheezing or shortness of breath. 11/09/15   Jacalyn LefevreJulie Haviland, MD  albuterol (PROVENTIL HFA;VENTOLIN HFA) 108 (90 Base) MCG/ACT inhaler Inhale 2 puffs into the lungs every 4 (four) hours as needed for wheezing or shortness of breath. 12/04/15   Kristen N Ward, DO  predniSONE (DELTASONE) 10 MG tablet Take 2 tablets (20 mg total) by mouth daily. 12/04/15   Layla MawKristen N Ward, DO    Family History No family history on file.  Social History Social History  Substance Use Topics  . Smoking status: Former Smoker    Quit date: 11/06/2015  . Smokeless tobacco: Never Used  . Alcohol use No     Allergies   Patient has no known allergies.   Review of Systems Review of Systems  Respiratory: Positive for cough and shortness of breath.   All other systems reviewed and are negative.    Physical Exam Updated Vital Signs BP 134/95   Pulse 81   Temp 97.7 F (36.5 C)  (Oral)   Resp 20   Ht 6\' 6"  (1.981 m)   Wt 255 lb (115.7 kg)   SpO2 99%   BMI 29.47 kg/m   Physical Exam  Constitutional: He is oriented to person, place, and time.  Coughing   HENT:  Head: Normocephalic.  OP slightly red, tonsils not enlarged   Eyes: EOM are normal. Pupils are equal, round, and reactive to light.  Neck: Normal range of motion. Neck supple.  Cardiovascular: Normal rate, regular rhythm and normal heart sounds.   Pulmonary/Chest:  Slightly tachypneic, + diffuse wheezing, worse on L side. No retractions.   Abdominal: Soft. Bowel sounds are normal. He exhibits no distension. There is no tenderness. There is no guarding.  Musculoskeletal: Normal range of motion.  Neurological: He is alert and oriented to person, place, and time.  Skin: Skin is warm.  Psychiatric: He has a normal mood and affect.  Nursing note and vitals reviewed.    ED Treatments / Results  Labs (all labs ordered are listed, but only abnormal results are displayed) Labs Reviewed  RAPID STREP SCREEN (NOT AT [facility])  CULTURE, GROUP A STREP [facility])    EKG  EKG Interpretation None       Radiology Dg Chest 2 View  Result Date: 06/12/2016 CLINICAL DATA:  Cough and wheezing. EXAM: CHEST  2 VIEW COMPARISON:  12/03/2015. FINDINGS: Mediastinum and hilar structures  normal. Minimal infiltrate right upper lobe cannot be excluded . No pleural effusion or pneumothorax. Degenerative changes thoracic spine . IMPRESSION: Minimal infiltrate right upper lobe cannot be excluded. Electronically Signed   By: Maisie Fushomas  Register   On: 06/12/2016 12:02    Procedures Procedures (including critical care time)  Medications Ordered in ED Medications  albuterol (PROVENTIL) (2.5 MG/3ML) 0.083% nebulizer solution (2.5 mg  Given 06/12/16 1109)  ipratropium-albuterol (DUONEB) 0.5-2.5 (3) MG/3ML nebulizer solution (3 mLs  Given 06/12/16 1109)  predniSONE (DELTASONE) tablet 60 mg (60 mg Oral Given 06/12/16 1135)    albuterol (PROVENTIL) (2.5 MG/3ML) 0.083% nebulizer solution 5 mg (5 mg Nebulization Given 06/12/16 1128)     Initial Impression / Assessment and Plan / ED Course  I have reviewed the triage vital signs and the nursing notes.  Pertinent labs & imaging results that were available during my care of the patient were reviewed by me and considered in my medical decision making (see chart for details).  Clinical Course    Darren Finley is a 20 y.o. male here with cough, wheezing. Likely asthma exacerbation. Has more wheezing L side so will get CXR. Will give nebs, steroids and reassess.   12:41 PM CXR showed possible RML pneumonia. Not hypoxic or febrile. Well appearing. Minimal wheezing on L side now. I think likely atypical pneumonia. Will dc home with prednisone, zpack, albuterol.    Final Clinical Impressions(s) / ED Diagnoses   Final diagnoses:  None    New Prescriptions New Prescriptions   No medications on file     Charlynne Panderavid Hsienta Yao, MD 06/12/16 1243

## 2016-06-12 NOTE — Discharge Instructions (Signed)
Take prednisone as prescribed.   Take zpack as prescribed.   Use albuterol every 4-6 hrs as needed.   See your doctor  Return to ER if you have trouble breathing, fever, wheezing, shortness of breath.

## 2016-06-12 NOTE — ED Triage Notes (Signed)
Cough x 3 weeks. Sob. His inhaler is not helping. Wheezing.

## 2016-06-14 LAB — CULTURE, GROUP A STREP (THRC)

## 2016-07-03 ENCOUNTER — Encounter (HOSPITAL_BASED_OUTPATIENT_CLINIC_OR_DEPARTMENT_OTHER): Payer: Self-pay | Admitting: *Deleted

## 2016-07-03 ENCOUNTER — Emergency Department (HOSPITAL_BASED_OUTPATIENT_CLINIC_OR_DEPARTMENT_OTHER)
Admission: EM | Admit: 2016-07-03 | Discharge: 2016-07-03 | Disposition: A | Discharge status: Home / Self Care | Payer: Medicaid Other | Attending: Emergency Medicine | Admitting: Emergency Medicine

## 2016-07-03 DIAGNOSIS — Z87891 Personal history of nicotine dependence: Secondary | ICD-10-CM | POA: Insufficient documentation

## 2016-07-03 DIAGNOSIS — R0602 Shortness of breath: Secondary | ICD-10-CM | POA: Diagnosis present

## 2016-07-03 DIAGNOSIS — J45901 Unspecified asthma with (acute) exacerbation: Secondary | ICD-10-CM | POA: Diagnosis not present

## 2016-07-03 MED ORDER — IPRATROPIUM-ALBUTEROL 0.5-2.5 (3) MG/3ML IN SOLN
3.0000 mL | RESPIRATORY_TRACT | Status: AC
  Administered 2016-07-03: 3 mL via RESPIRATORY_TRACT
  Filled 2016-07-03: qty 3

## 2016-07-03 MED ORDER — ALBUTEROL SULFATE HFA 108 (90 BASE) MCG/ACT IN AERS
2.0000 | INHALATION_SPRAY | Freq: Once | RESPIRATORY_TRACT | Status: AC
  Administered 2016-07-03: 2 via RESPIRATORY_TRACT
  Filled 2016-07-03: qty 6.7

## 2016-07-03 MED ORDER — PREDNISONE 10 MG (21) PO TBPK: 10.0000 mg | ORAL_TABLET | Freq: Every day | ORAL | 0 refills | Status: DC

## 2016-07-03 MED ORDER — IPRATROPIUM-ALBUTEROL 0.5-2.5 (3) MG/3ML IN SOLN
RESPIRATORY_TRACT | Status: AC
  Administered 2016-07-03: 3 mL
  Filled 2016-07-03: qty 3

## 2016-07-03 MED ORDER — ALBUTEROL SULFATE HFA 108 (90 BASE) MCG/ACT IN AERS: 1.0000 | INHALATION_SPRAY | Freq: Four times a day (QID) | RESPIRATORY_TRACT | 1 refills | Status: DC | PRN

## 2016-07-03 MED ORDER — PREDNISONE 50 MG PO TABS
60.0000 mg | ORAL_TABLET | Freq: Once | ORAL | Status: AC
  Administered 2016-07-03: 60 mg via ORAL
  Filled 2016-07-03: qty 1

## 2016-07-03 MED ORDER — ALBUTEROL SULFATE (2.5 MG/3ML) 0.083% IN NEBU
2.5000 mg | INHALATION_SOLUTION | RESPIRATORY_TRACT | Status: AC
  Administered 2016-07-03: 2.5 mg via RESPIRATORY_TRACT
  Filled 2016-07-03: qty 3

## 2016-07-03 MED ORDER — ALBUTEROL SULFATE (2.5 MG/3ML) 0.083% IN NEBU
INHALATION_SOLUTION | RESPIRATORY_TRACT | Status: AC
  Administered 2016-07-03: 2.5 mg
  Filled 2016-07-03: qty 3

## 2016-07-03 NOTE — ED Triage Notes (Signed)
Pt reports asthma exacerbation, productive cough and runny nose x1wk. Reports his inhaler ran out approx 1-2 mos ago. Denies fever, n/v/d, sore throat. Reports intermittent epigastric pain that radiates up chest/back.

## 2016-07-03 NOTE — Discharge Instructions (Signed)
Read the information below.  Use the prescribed medication as directed.  Please discuss all new medications with your pharmacist.  You may return to the Emergency Department at any time for worsening condition or any new symptoms that concern you.   If you develop worsening shortness of breath, uncontrolled wheezing, severe chest pain, or fevers despite using tylenol and/or ibuprofen, return for a recheck.     °

## 2016-07-03 NOTE — ED Provider Notes (Signed)
[facility]   CSN: [id number] Arrival date & time: 07/03/16  [id number]     History   Chief Complaint Chief Complaint  Patient presents with  . Cough  . Asthma    HPI Darren Finley is a 20 y.o. male.  HPI   Patient with hx asthma presents with cough productive of white sputum, SOB, wheezing, occasionally random lower chest pain that occurs for 2-3 minutes at a time, radiates through to the back.  Also has rhinorrhea.  Pt was seen on 06/12/16 for asthma exacerbation and RML pneumonia.  He took the z-pak and prednisone but could not afford the albuterol inhaler.  Denies fevers, chills, myalgias, recent immobilization, leg swelling, hx blood clots.  Has allergy to dust only, does not take allergy medications.  He reports he has never been admitted for his asthma.    Past Medical History:  Diagnosis Date  . Asthma     There are no active problems to display for this patient.   History reviewed. No pertinent surgical history.     Home Medications    Prior to Admission medications   Medication Sig Start Date End Date Taking? Authorizing Provider  albuterol (PROVENTIL HFA;VENTOLIN HFA) 108 (90 Base) MCG/ACT inhaler Inhale 1-2 puffs into the lungs every 6 (six) hours as needed for wheezing or shortness of breath. 07/03/16   Trixie DredgeEmily Ailah Barna, PA-C  predniSONE (STERAPRED UNI-PAK 21 TAB) 10 MG (21) TBPK tablet Take 1 tablet (10 mg total) by mouth daily. Day 1: take 6 tabs.  Day 2: 5 tabs  Day 3: 4 tabs  Day 4: 3 tabs  Day 5: 2 tabs  Day 6: 1 tab 07/03/16   Trixie DredgeEmily Bailee Thall, PA-C    Family History No family history on file.  Social History Social History  Substance Use Topics  . Smoking status: Former Smoker    Quit date: 11/06/2015  . Smokeless tobacco: Never Used  . Alcohol use Yes     Comment: occasionally     Allergies   Patient has no known allergies.   Review of Systems Review of Systems  All other systems reviewed and are negative.    Physical  Exam Updated Vital Signs BP 143/92 (BP Location: Left Arm)   Pulse 96   Temp 97.6 F (36.4 C) (Oral)   Resp 18   Ht 6\' 6"  (1.981 m)   Wt 113.4 kg   SpO2 100%   BMI 28.89 kg/m   Physical Exam  Constitutional: He appears well-developed and well-nourished. No distress.  HENT:  Head: Normocephalic and atraumatic.  Nose: Mucosal edema present.  Mouth/Throat: Uvula is midline and oropharynx is clear and moist. No trismus in the jaw. No uvula swelling. No oropharyngeal exudate, posterior oropharyngeal edema, posterior oropharyngeal erythema or tonsillar abscesses.  Nasal mucosa erythematous  Neck: Neck supple.  Cardiovascular: Normal rate and regular rhythm.   Pulmonary/Chest: Effort normal. No respiratory distress. Wheezes: loud diffuse expiratory wheezing. He has no rales.  Abdominal: Soft. He exhibits no distension and no mass. There is no tenderness. There is no rebound and no guarding.  Neurological: He is alert. He exhibits normal muscle tone.  Skin: He is not diaphoretic.  Nursing note and vitals reviewed.    ED Treatments / Results  Labs (all labs ordered are listed, but only abnormal results are displayed) Labs Reviewed - No data to display  EKG  EKG Interpretation None       Radiology No results found.  Procedures Procedures (  including critical care time)  Medications Ordered in ED Medications  albuterol (PROVENTIL) (2.5 MG/3ML) 0.083% nebulizer solution (2.5 mg  Given 07/03/16 1011)  ipratropium-albuterol (DUONEB) 0.5-2.5 (3) MG/3ML nebulizer solution (3 mLs  Given 07/03/16 1011)  albuterol (PROVENTIL HFA;VENTOLIN HFA) 108 (90 Base) MCG/ACT inhaler 2 puff (2 puffs Inhalation Given 07/03/16 1024)  albuterol (PROVENTIL) (2.5 MG/3ML) 0.083% nebulizer solution 2.5 mg (2.5 mg Nebulization Given 07/03/16 1044)  ipratropium-albuterol (DUONEB) 0.5-2.5 (3) MG/3ML nebulizer solution 3 mL (3 mLs Nebulization Given 07/03/16 1044)  predniSONE (DELTASONE) tablet 60 mg (60  mg Oral Given 07/03/16 1040)     Initial Impression / Assessment and Plan / ED Course  I have reviewed the triage vital signs and the nursing notes.  Pertinent labs & imaging results that were available during my care of the patient were reviewed by me and considered in my medical decision making (see chart for details).  Clinical Course as of Jul 04 1151  Mon Jul 03, 2016  1032 Patient notes great improvement after first breathing treatment.  Lungs with only mild wheezing currently, moving air well throughout.   [EW]  1115 Patient reports complete improvement.  Lungs CTAB.    [EW]    Clinical Course User Index [EW] Trixie DredgeEmily Cia Garretson, PA-C    Afebrile, nontoxic patient with asthma exacerbation, recent pneumonia.  Nebs with great improvement.  Doubt persistent pneumonia.  Albuterol hfa given to patient.   D/C home with albuterol, prednisone.  Discussed result, findings, treatment, and follow up  with patient.  Pt given return precautions.  Pt verbalizes understanding and agrees with plan.       Final Clinical Impressions(s) / ED Diagnoses   Final diagnoses:  Exacerbation of asthma, unspecified asthma severity, unspecified whether persistent    New Prescriptions Discharge Medication List as of 07/03/2016 11:17 AM    START taking these medications   Details  predniSONE (STERAPRED UNI-PAK 21 TAB) 10 MG (21) TBPK tablet Take 1 tablet (10 mg total) by mouth daily. Day 1: take 6 tabs.  Day 2: 5 tabs  Day 3: 4 tabs  Day 4: 3 tabs  Day 5: 2 tabs  Day 6: 1 tab, Starting Mon 07/03/2016, Print         [place]ily Brion Hedges, PA-C 07/03/16 1152    Maia PlanJoshua G Long, MD 07/03/16 1214

## 2016-07-03 NOTE — ED Notes (Signed)
ED Provider at bedside. 

## 2016-07-30 ENCOUNTER — Emergency Department (HOSPITAL_BASED_OUTPATIENT_CLINIC_OR_DEPARTMENT_OTHER)
Admission: EM | Admit: 2016-07-30 | Discharge: 2016-07-30 | Disposition: A | Discharge status: Home / Self Care | Payer: Medicaid Other | Attending: Emergency Medicine | Admitting: Emergency Medicine

## 2016-07-30 ENCOUNTER — Encounter (HOSPITAL_BASED_OUTPATIENT_CLINIC_OR_DEPARTMENT_OTHER): Payer: Self-pay | Admitting: *Deleted

## 2016-07-30 DIAGNOSIS — R0602 Shortness of breath: Secondary | ICD-10-CM | POA: Diagnosis present

## 2016-07-30 DIAGNOSIS — Z79899 Other long term (current) drug therapy: Secondary | ICD-10-CM | POA: Diagnosis not present

## 2016-07-30 DIAGNOSIS — Z87891 Personal history of nicotine dependence: Secondary | ICD-10-CM | POA: Diagnosis not present

## 2016-07-30 DIAGNOSIS — J4521 Mild intermittent asthma with (acute) exacerbation: Secondary | ICD-10-CM | POA: Insufficient documentation

## 2016-07-30 MED ORDER — ALBUTEROL SULFATE (2.5 MG/3ML) 0.083% IN NEBU
5.0000 mg | INHALATION_SOLUTION | Freq: Once | RESPIRATORY_TRACT | Status: AC
  Administered 2016-07-30: 5 mg via RESPIRATORY_TRACT
  Filled 2016-07-30: qty 6

## 2016-07-30 MED ORDER — DEXAMETHASONE 6 MG PO TABS
10.0000 mg | ORAL_TABLET | Freq: Once | ORAL | Status: AC
  Administered 2016-07-30: 10 mg via ORAL
  Filled 2016-07-30: qty 1

## 2016-07-30 MED ORDER — IPRATROPIUM BROMIDE 0.02 % IN SOLN
0.5000 mg | Freq: Once | RESPIRATORY_TRACT | Status: AC
  Administered 2016-07-30: 0.5 mg via RESPIRATORY_TRACT
  Filled 2016-07-30: qty 2.5

## 2016-07-30 MED ORDER — AEROCHAMBER PLUS FLO-VU MEDIUM MISC
1.0000 | Freq: Once | Status: AC
  Administered 2016-07-30: 1
  Filled 2016-07-30: qty 1

## 2016-07-30 MED ORDER — ALBUTEROL SULFATE HFA 108 (90 BASE) MCG/ACT IN AERS
2.0000 | INHALATION_SPRAY | Freq: Once | RESPIRATORY_TRACT | Status: AC
  Administered 2016-07-30: 2 via RESPIRATORY_TRACT
  Filled 2016-07-30: qty 6.7

## 2016-07-30 NOTE — ED Triage Notes (Signed)
Patient c/o sob, productive cough for the past week. Generalized body aches, no meds taken. He had an inhaler but it is empty

## 2016-07-30 NOTE — ED Provider Notes (Signed)
[facility] Provider [facility]   CSN: [id number] Arrival date & time: 07/30/16  [id number]     History   Chief Complaint Chief Complaint  Patient presents with  . Shortness of Breath    HPI Darren Finley is a 21 y.o. male.  The history is provided by the patient.  Shortness of Breath  This is a new problem. The average episode lasts 2 weeks. The problem occurs intermittently.The current episode started more than 1 week ago. The problem has not changed (worse in mornings) since onset.Associated symptoms include cough. Pertinent negatives include no fever. The problem's precipitants include weather/humidity. He has tried nothing (Ran out of home medications) for the symptoms. He has had no prior hospitalizations. He has had prior ED visits. Associated medical issues include asthma.    Past Medical History:  Diagnosis Date  . Asthma     There are no active problems to display for this patient.   History reviewed. No pertinent surgical history.     Home Medications    Prior to Admission medications   Medication Sig Start Date End Date Taking? Authorizing Provider  albuterol (PROVENTIL HFA;VENTOLIN HFA) 108 (90 Base) MCG/ACT inhaler Inhale 1-2 puffs into the lungs every 6 (six) hours as needed for wheezing or shortness of breath. 07/03/16   Trixie DredgeEmily West, PA-C  predniSONE (STERAPRED UNI-PAK 21 TAB) 10 MG (21) TBPK tablet Take 1 tablet (10 mg total) by mouth daily. Day 1: take 6 tabs.  Day 2: 5 tabs  Day 3: 4 tabs  Day 4: 3 tabs  Day 5: 2 tabs  Day 6: 1 tab 07/03/16   Trixie DredgeEmily West, PA-C    Family History History reviewed. No pertinent family history.  Social History Social History  Substance Use Topics  . Smoking status: Former Smoker    Quit date: 11/06/2015  . Smokeless tobacco: Never Used  . Alcohol use Yes     Comment: occasionally     Allergies   Patient has no known allergies.   Review of Systems Review of Systems  Constitutional: Negative for fever.    Respiratory: Positive for cough and shortness of breath.   All other systems reviewed and are negative.    Physical Exam Updated Vital Signs BP 136/78 (BP Location: Right Arm)   Pulse 112   Temp 98 F (36.7 C) (Oral)   Resp 16   SpO2 96%   Physical Exam  Constitutional: He is oriented to person, place, and time. He appears well-developed and well-nourished. No distress.  HENT:  Head: Normocephalic and atraumatic.  Nose: Nose normal.  Eyes: Conjunctivae are normal.  Neck: Neck supple. No tracheal deviation present.  Cardiovascular: Normal rate and regular rhythm.   Pulmonary/Chest: Effort normal. No respiratory distress. He has wheezes (coarse inspiratory and expiratory bilateral).  Speaking in full sentences, snapchatting on cell phone  Abdominal: Soft. He exhibits no distension.  Neurological: He is alert and oriented to person, place, and time.  Skin: Skin is warm and dry.  Psychiatric: He has a normal mood and affect.     ED Treatments / Results  Labs (all labs ordered are listed, but only abnormal results are displayed) Labs Reviewed - No data to display  EKG  EKG Interpretation None       Radiology No results found.  Procedures Procedures (including critical care time)  Medications Ordered in ED Medications  albuterol (PROVENTIL) (2.5 MG/3ML) 0.083% nebulizer solution 5 mg (5 mg Nebulization Given 07/30/16 1013)  ipratropium (ATROVENT) nebulizer  solution 0.5 mg (0.5 mg Nebulization Given 07/30/16 1012)  albuterol (PROVENTIL HFA;VENTOLIN HFA) 108 (90 Base) MCG/ACT inhaler 2 puff (2 puffs Inhalation Given 07/30/16 1024)  AEROCHAMBER PLUS FLO-VU MEDIUM MISC 1 each (1 each Other Given 07/30/16 1023)  dexamethasone (DECADRON) tablet 10 mg (10 mg Oral Given 07/30/16 1026)     Initial Impression / Assessment and Plan / ED Course  I have reviewed the triage vital signs and the nursing notes.  Pertinent labs & imaging results that were available during my care of the  patient were reviewed by me and considered in my medical decision making (see chart for details).  Clinical Course     21 year old male presents to 2 weeks of mild intermittent wheezing in setting of recent cold weather change. Has history of asthma and has been lost to follow-up by PCP, when asked if he had a PCP he laughed and said he had not been in a long time. I discussed ongoing care of asthma and preventative care with routine follow-up to prevent need for recurrent emergency department visits. Provided Decadron for symptomatic relief and given inhaler and spacer for home. No symptoms or signs of recurrent pneumonia and no indication for imaging.  Final Clinical Impressions(s) / ED Diagnoses   Final diagnoses:  Asthma in adult, mild intermittent, with acute exacerbation    New Prescriptions New Prescriptions   No medications on file     Lyndal Pulley, MD 07/30/16 1101

## 2016-09-23 ENCOUNTER — Emergency Department (HOSPITAL_BASED_OUTPATIENT_CLINIC_OR_DEPARTMENT_OTHER)
Admission: EM | Admit: 2016-09-23 | Discharge: 2016-09-23 | Disposition: A | Discharge status: Home / Self Care | Payer: Medicaid Other | Attending: Physician Assistant | Admitting: Physician Assistant

## 2016-09-23 ENCOUNTER — Encounter (HOSPITAL_BASED_OUTPATIENT_CLINIC_OR_DEPARTMENT_OTHER): Payer: Self-pay | Admitting: Emergency Medicine

## 2016-09-23 DIAGNOSIS — J45909 Unspecified asthma, uncomplicated: Secondary | ICD-10-CM | POA: Diagnosis not present

## 2016-09-23 DIAGNOSIS — J029 Acute pharyngitis, unspecified: Secondary | ICD-10-CM | POA: Diagnosis present

## 2016-09-23 DIAGNOSIS — J020 Streptococcal pharyngitis: Secondary | ICD-10-CM | POA: Insufficient documentation

## 2016-09-23 DIAGNOSIS — Z87891 Personal history of nicotine dependence: Secondary | ICD-10-CM | POA: Diagnosis not present

## 2016-09-23 LAB — RAPID STREP SCREEN (MED CTR MEBANE ONLY): Streptococcus, Group A Screen (Direct): POSITIVE — AB

## 2016-09-23 MED ORDER — PENICILLIN V POTASSIUM 500 MG PO TABS: 500.0000 mg | ORAL_TABLET | Freq: Three times a day (TID) | ORAL | 0 refills | Status: AC

## 2016-09-23 NOTE — ED Provider Notes (Signed)
[facility]   CSN: [id number] Arrival date & time: 09/23/16  1326     History   Chief Complaint Chief Complaint  Patient presents with  . Sore Throat    HPI Darren Finley is a 21 y.o. male.  The history is provided by the patient and medical records. No language interpreter was used.  Sore Throat  Pertinent negatives include no chest pain and no shortness of breath.   Darren Finley is a 21 y.o. male  with a PMH of asthma who presents to the Emergency Department complaining of persistent, worsening sore throat 3-4 days associated with dry cough. No fever, chills, congestion, headache. No known sick contacts. No medications taken prior to arrival for symptoms.   Past Medical History:  Diagnosis Date  . Asthma     There are no active problems to display for this patient.   History reviewed. No pertinent surgical history.     Home Medications    Prior to Admission medications   Medication Sig Start Date End Date Taking? Authorizing Provider  albuterol (PROVENTIL HFA;VENTOLIN HFA) 108 (90 Base) MCG/ACT inhaler Inhale 1-2 puffs into the lungs every 6 (six) hours as needed for wheezing or shortness of breath. 07/03/16  Yes Trixie Dredge, PA-C  penicillin v potassium (VEETID) 500 MG tablet Take 1 tablet (500 mg total) by mouth 3 (three) times daily. 09/23/16 09/30/16  Chase Picket Senai Ramnath, PA-C  predniSONE (STERAPRED UNI-PAK 21 TAB) 10 MG (21) TBPK tablet Take 1 tablet (10 mg total) by mouth daily. Day 1: take 6 tabs.  Day 2: 5 tabs  Day 3: 4 tabs  Day 4: 3 tabs  Day 5: 2 tabs  Day 6: 1 tab 07/03/16   Trixie Dredge, PA-C    Family History No family history on file.  Social History Social History  Substance Use Topics  . Smoking status: Former Smoker    Quit date: 11/06/2015  . Smokeless tobacco: Never Used  . Alcohol use Yes     Comment: occasionally     Allergies   Patient has no known allergies.   Review of Systems Review of Systems    Constitutional: Negative for fever.  HENT: Positive for sore throat. Negative for ear pain and trouble swallowing.   Eyes: Negative for redness.  Respiratory: Positive for cough. Negative for shortness of breath and wheezing.   Cardiovascular: Negative for chest pain.     Physical Exam Updated Vital Signs BP 114/77 (BP Location: Left Arm)   Pulse 78   Temp 98.2 F (36.8 C)   Resp 19   Ht 6\' 6"  (1.981 m)   Wt 111.1 kg   SpO2 99%   BMI 28.31 kg/m   Physical Exam  Constitutional: He is oriented to person, place, and time. He appears well-developed and well-nourished. No distress.  HENT:  Head: Normocephalic and atraumatic.  Oropharynx with erythema and tonsillar hypertrophy, no exudates.  Cardiovascular: Normal rate, regular rhythm and normal heart sounds.   No murmur heard. Pulmonary/Chest: Effort normal and breath sounds normal. No respiratory distress. He has no wheezes. He has no rales.  Abdominal: Soft. He exhibits no distension. There is no tenderness.  Musculoskeletal: He exhibits no edema.  Lymphadenopathy:    He has cervical adenopathy.  Neurological: He is alert and oriented to person, place, and time.  Skin: Skin is warm and dry.  Nursing note and vitals reviewed.    ED Treatments / Results  Labs (all labs ordered are listed,  but only abnormal results are displayed) Labs Reviewed  RAPID STREP SCREEN (NOT AT [facility]) - Abnormal; Notable for the following:       Result Value   Streptococcus, Group A Screen (Direct) POSITIVE (*)    All other components within normal limits    EKG  EKG Interpretation None       Radiology No results found.  Procedures Procedures (including critical care time)  Medications Ordered in ED Medications - No data to display   Initial Impression / Assessment and Plan / ED Course  I have reviewed the triage vital signs and the nursing notes.  Pertinent labs & imaging results that were available during my care of the  patient were reviewed by me and considered in my medical decision making (see chart for details).    Darren Finley is a 21 y.o. male who presents to ED for sore throat. Rapid strep positive. Presentation not concerning for peritonsillar abscess or infection spreads soft tissue. No trismus or uvula deviation. Will treat with PenVK. Patient is tolerating PO in ED without difficulty. Swallowing secretions fine. Increase hydration. PCP follow-up if symptoms persist. All questions answered.    Final Clinical Impressions(s) / ED Diagnoses   Final diagnoses:  Strep pharyngitis    New Prescriptions Discharge Medication List as of 09/23/2016  2:41 PM    START taking these medications   Details  penicillin v potassium (VEETID) 500 MG tablet Take 1 tablet (500 mg total) by mouth 3 (three) times daily., Starting Sat 09/23/2016, Until Sat 09/30/2016, Print         [facility] GroupJaime Pilcher Analiya Porco, PA-C 09/23/16 1537    Courteney Randall AnLyn Mackuen, MD 09/24/16 [phone]

## 2016-09-23 NOTE — Discharge Instructions (Signed)
Please take all of your antibiotics until finished! Follow-up with your primary care provider if symptoms do not improve following course of antibiotics. Return to ER for new or worsening symptoms, any additional concerns.

## 2016-09-23 NOTE — ED Triage Notes (Signed)
Sore throat x 3-4 days ago and cough, denies fever

## 2017-01-15 ENCOUNTER — Encounter (HOSPITAL_BASED_OUTPATIENT_CLINIC_OR_DEPARTMENT_OTHER): Payer: Self-pay | Admitting: *Deleted

## 2017-01-15 DIAGNOSIS — R05 Cough: Secondary | ICD-10-CM | POA: Insufficient documentation

## 2017-01-15 DIAGNOSIS — Z5321 Procedure and treatment not carried out due to patient leaving prior to being seen by health care provider: Secondary | ICD-10-CM | POA: Diagnosis not present

## 2017-01-15 NOTE — ED Triage Notes (Addendum)
Pt c/o cough  X 2 days with HX asthma  Out of albuteral

## 2017-01-16 ENCOUNTER — Emergency Department (HOSPITAL_BASED_OUTPATIENT_CLINIC_OR_DEPARTMENT_OTHER)
Admission: EM | Admit: 2017-01-16 | Discharge: 2017-01-16 | Disposition: A | Discharge status: Home / Self Care | Payer: Medicaid Other | Attending: Emergency Medicine | Admitting: Emergency Medicine

## 2017-06-04 ENCOUNTER — Other Ambulatory Visit: Payer: Self-pay

## 2017-06-04 ENCOUNTER — Encounter (HOSPITAL_BASED_OUTPATIENT_CLINIC_OR_DEPARTMENT_OTHER): Payer: Self-pay

## 2017-06-04 DIAGNOSIS — F17200 Nicotine dependence, unspecified, uncomplicated: Secondary | ICD-10-CM | POA: Insufficient documentation

## 2017-06-04 DIAGNOSIS — J4521 Mild intermittent asthma with (acute) exacerbation: Secondary | ICD-10-CM | POA: Insufficient documentation

## 2017-06-04 NOTE — ED Triage Notes (Signed)
C/o flu like sx x "few weeks"-NAD-steady gait

## 2017-06-05 ENCOUNTER — Emergency Department (HOSPITAL_BASED_OUTPATIENT_CLINIC_OR_DEPARTMENT_OTHER)
Admission: EM | Admit: 2017-06-05 | Discharge: 2017-06-05 | Disposition: A | Discharge status: Home / Self Care | Payer: Self-pay | Attending: Emergency Medicine | Admitting: Emergency Medicine

## 2017-06-05 DIAGNOSIS — J4521 Mild intermittent asthma with (acute) exacerbation: Secondary | ICD-10-CM

## 2017-06-05 MED ORDER — ALBUTEROL SULFATE HFA 108 (90 BASE) MCG/ACT IN AERS
2.0000 | INHALATION_SPRAY | RESPIRATORY_TRACT | Status: DC | PRN
  Administered 2017-06-05: 2 via RESPIRATORY_TRACT
  Filled 2017-06-05: qty 6.7

## 2017-06-05 MED ORDER — ALBUTEROL SULFATE HFA 108 (90 BASE) MCG/ACT IN AERS: 2.0000 | INHALATION_SPRAY | RESPIRATORY_TRACT | 0 refills | Status: DC | PRN

## 2017-06-05 MED ORDER — DEXAMETHASONE SODIUM PHOSPHATE 10 MG/ML IJ SOLN
10.0000 mg | Freq: Once | INTRAMUSCULAR | Status: AC
  Administered 2017-06-05: 10 mg via INTRAMUSCULAR
  Filled 2017-06-05: qty 1

## 2017-06-05 NOTE — ED Provider Notes (Signed)
[facility] EMERGENCY DEPARTMENT Provider Note   CSN: [id number] Arrival date & time: 06/04/17  2258     History   Chief Complaint Chief Complaint  Patient presents with  . Cough    HPI Darren Finley is a 21 y.o. male.  HPI  This is a 21 year old male with a history of asthma who presents with cough and chest congestion.  Patient reports 2-3-week history of chest congestion, cough, chest pressure.  Symptoms have been on and off.  Sometimes worse at night.  He does not have his inhaler.  Denies any fevers or productivity of the cough.  Denies any significant earache, rhinorrhea, sore throat.  Denies recent hospitalizations for asthma.  Past Medical History:  Diagnosis Date  . Asthma     There are no active problems to display for this patient.   History reviewed. No pertinent surgical history.     Home Medications    Prior to Admission medications   Medication Sig Start Date End Date Taking? Authorizing Provider  albuterol (PROVENTIL HFA;VENTOLIN HFA) 108 (90 Base) MCG/ACT inhaler Inhale 2 puffs every 4 (four) hours as needed into the lungs for wheezing or shortness of breath. 06/05/17   Horton, Mayer Maskerourtney F, MD    Family History No family history on file.  Social History Social History   Tobacco Use  . Smoking status: Current Every Day Smoker  . Smokeless tobacco: Never Used  Substance Use Topics  . Alcohol use: Yes    Comment: occ  . Drug use: Yes    Types: Marijuana     Allergies   Patient has no known allergies.   Review of Systems Review of Systems  Constitutional: Negative for fever.  HENT: Negative for congestion and rhinorrhea.   Respiratory: Positive for cough, chest tightness and shortness of breath.   Cardiovascular: Negative for chest pain and leg swelling.  Gastrointestinal: Negative for abdominal pain, nausea and vomiting.  All other systems reviewed and are negative.    Physical Exam Updated Vital Signs BP 111/68  (BP Location: Left Arm)   Pulse 72   Temp 98.4 F (36.9 C) (Oral)   Resp 20   Ht 6\' 6"  (1.981 m)   Wt 106.1 kg (234 lb)   SpO2 97%   BMI 27.04 kg/m   Physical Exam  Constitutional: He is oriented to person, place, and time. He appears well-developed and well-nourished.  HENT:  Head: Normocephalic and atraumatic.  Cardiovascular: Normal rate, regular rhythm and normal heart sounds.  No murmur heard. Pulmonary/Chest: Effort normal. No respiratory distress. He has wheezes.  Good air movement, expiratory wheezing in all lung fields  Abdominal: Soft. Bowel sounds are normal. There is no tenderness. There is no rebound.  Musculoskeletal: He exhibits no edema.  Neurological: He is alert and oriented to person, place, and time.  Skin: Skin is warm and dry.  Psychiatric: He has a normal mood and affect.  Nursing note and vitals reviewed.    ED Treatments / Results  Labs (all labs ordered are listed, but only abnormal results are displayed) Labs Reviewed - No data to display  EKG  EKG Interpretation None       Radiology No results found.  Procedures Procedures (including critical care time)  Medications Ordered in ED Medications  albuterol (PROVENTIL HFA;VENTOLIN HFA) 108 (90 Base) MCG/ACT inhaler 2 puff (2 puffs Inhalation Given 06/05/17 0047)  dexamethasone (DECADRON) injection 10 mg (10 mg Intramuscular Given 06/05/17 0043)     Initial Impression /  Assessment and Plan / ED Course  I have reviewed the triage vital signs and the nursing notes.  Pertinent labs & imaging results that were available during my care of the patient were reviewed by me and considered in my medical decision making (see chart for details).     Patient presents with chest tightness, cough, shortness of breath.  Ongoing for the last 2-3 weeks.  He is nontoxic appearing.  Vital signs are reassuring.  Afebrile.  On exam he is wheezing in all lung fields with fair air movement.  Suspect asthma  exacerbation.  He is in no acute distress.  He was given an inhaler and Decadron.  On recheck, he states he feels somewhat improved.  Continues to be in no acute distress.  Improved aeration with minimal wheezing.  Recommend continued albuterol at home for symptom control.  After history, exam, and medical workup I feel the patient has been appropriately medically screened and is safe for discharge home. Pertinent diagnoses were discussed with the patient. Patient was given return precautions.   Final Clinical Impressions(s) / ED Diagnoses   Final diagnoses:  Mild intermittent asthma with exacerbation    ED Discharge Orders        Ordered    albuterol (PROVENTIL HFA;VENTOLIN HFA) 108 (90 Base) MCG/ACT inhaler  Every 4 hours PRN     06/05/17 0112       Shon BatonHorton, Courtney F, MD 06/05/17 [id number]

## 2017-07-17 ENCOUNTER — Emergency Department (HOSPITAL_BASED_OUTPATIENT_CLINIC_OR_DEPARTMENT_OTHER)
Admission: EM | Admit: 2017-07-17 | Discharge: 2017-07-17 | Disposition: A | Discharge status: Home / Self Care | Payer: Self-pay | Attending: Emergency Medicine | Admitting: Emergency Medicine

## 2017-07-17 ENCOUNTER — Encounter (HOSPITAL_BASED_OUTPATIENT_CLINIC_OR_DEPARTMENT_OTHER): Payer: Self-pay | Admitting: Emergency Medicine

## 2017-07-17 ENCOUNTER — Other Ambulatory Visit: Payer: Self-pay

## 2017-07-17 DIAGNOSIS — R140 Abdominal distension (gaseous): Secondary | ICD-10-CM | POA: Insufficient documentation

## 2017-07-17 DIAGNOSIS — R1013 Epigastric pain: Secondary | ICD-10-CM | POA: Insufficient documentation

## 2017-07-17 DIAGNOSIS — R197 Diarrhea, unspecified: Secondary | ICD-10-CM | POA: Insufficient documentation

## 2017-07-17 DIAGNOSIS — F17200 Nicotine dependence, unspecified, uncomplicated: Secondary | ICD-10-CM | POA: Insufficient documentation

## 2017-07-17 DIAGNOSIS — J45909 Unspecified asthma, uncomplicated: Secondary | ICD-10-CM | POA: Insufficient documentation

## 2017-07-17 MED ORDER — PANTOPRAZOLE SODIUM 40 MG PO TBEC
40.0000 mg | DELAYED_RELEASE_TABLET | Freq: Once | ORAL | Status: AC
  Administered 2017-07-17: 40 mg via ORAL
  Filled 2017-07-17: qty 1

## 2017-07-17 MED ORDER — PANTOPRAZOLE SODIUM 40 MG PO TBEC: 40.0000 mg | DELAYED_RELEASE_TABLET | Freq: Every day | ORAL | 0 refills | Status: DC

## 2017-07-17 MED ORDER — DICYCLOMINE HCL 20 MG PO TABS: 20.0000 mg | ORAL_TABLET | Freq: Two times a day (BID) | ORAL | 0 refills | Status: DC

## 2017-07-17 MED ORDER — DICYCLOMINE HCL 10 MG PO CAPS
20.0000 mg | ORAL_CAPSULE | Freq: Once | ORAL | Status: AC
  Administered 2017-07-17: 20 mg via ORAL
  Filled 2017-07-17: qty 2

## 2017-07-17 NOTE — Discharge Instructions (Signed)
You have presented to the emergency department with 3 weeks of diarrhea and abdominal discomfort.  I recommend outpatient stool testing which may be organized through a PCP as well as continued outpatient evaluation for cause of your pain.  These symptoms may be infectious but may be due to other causes and I recommend continued work up through a PCP and potentially through a GI physician if your symptoms do not improve.

## 2017-07-17 NOTE — ED Provider Notes (Signed)
[facility] EMERGENCY DEPARTMENT Provider Note   CSN: [id number] Arrival date & time: 07/17/17  1228     History   Chief Complaint Chief Complaint  Patient presents with  . Abdominal Pain    HPI Darren Finley is a 21 y.o. male.  HPI   Presents with concern for abdominal discomfort, bloating. Reports diarrhea 5 times daily for the past 3 weeks.  No nausea or vomiting.  Reports it feels like a constant bloating, is worse with spicey foods.  Also reports heart burn after eating.  Today had cramping abdominal pain and bloating across epigastric area.   No known sick contacts.    Past Medical History:  Diagnosis Date  . Asthma     There are no active problems to display for this patient.   History reviewed. No pertinent surgical history.     Home Medications    Prior to Admission medications   Medication Sig Start Date End Date Taking? Authorizing Provider  albuterol (PROVENTIL HFA;VENTOLIN HFA) 108 (90 Base) MCG/ACT inhaler Inhale 2 puffs every 4 (four) hours as needed into the lungs for wheezing or shortness of breath. 06/05/17   Horton, Mayer Maskerourtney F, MD  dicyclomine (BENTYL) 20 MG tablet Take 1 tablet (20 mg total) by mouth 2 (two) times daily. 07/17/17   Alvira MondaySchlossman, Fillmore Bynum, MD  pantoprazole (PROTONIX) 40 MG tablet Take 1 tablet (40 mg total) by mouth daily for 10 days. 07/17/17 07/27/17  Alvira MondaySchlossman, Kaylan Friedmann, MD    Family History History reviewed. No pertinent family history.  Social History Social History   Tobacco Use  . Smoking status: Current Every Day Smoker  . Smokeless tobacco: Never Used  Substance Use Topics  . Alcohol use: Yes    Comment: occ  . Drug use: Yes    Types: Marijuana     Allergies   Patient has no known allergies.   Review of Systems Review of Systems  Constitutional: Negative for fever.  HENT: Negative for sore throat.   Eyes: Negative for visual disturbance.  Respiratory: Negative for shortness of breath.     Cardiovascular: Negative for chest pain.  Gastrointestinal: Positive for abdominal distention, abdominal pain and diarrhea. Negative for constipation, nausea and vomiting.  Genitourinary: Negative for difficulty urinating.  Musculoskeletal: Negative for back pain and neck stiffness.  Skin: Negative for rash.  Neurological: Negative for syncope and headaches.     Physical Exam Updated Vital Signs BP 135/86 (BP Location: Left Arm)   Pulse 72   Temp 98.1 F (36.7 C) (Oral)   Resp 18   Ht 6\' 6"  (1.981 m)   Wt 106.6 kg (235 lb)   SpO2 98%   BMI 27.16 kg/m   Physical Exam  Constitutional: He is oriented to person, place, and time. He appears well-developed and well-nourished. No distress.  HENT:  Head: Normocephalic and atraumatic.  Eyes: Conjunctivae and EOM are normal.  Neck: Normal range of motion.  Cardiovascular: Normal rate, regular rhythm, normal heart sounds and intact distal pulses. Exam reveals no gallop and no friction rub.  No murmur heard. Pulmonary/Chest: Effort normal and breath sounds normal. No respiratory distress. He has no wheezes. He has no rales.  Abdominal: Soft. He exhibits no distension. There is no tenderness. There is no guarding.  Musculoskeletal: He exhibits no edema.  Neurological: He is alert and oriented to person, place, and time.  Skin: Skin is warm and dry. He is not diaphoretic.  Nursing note and vitals reviewed.    ED Treatments /  Results  Labs (all labs ordered are listed, but only abnormal results are displayed) Labs Reviewed - No data to display  EKG  EKG Interpretation  Date/Time:  Tuesday July 17 2017 13:36:58 EST Ventricular Rate:  59 PR Interval:    QRS Duration: 87 QT Interval:  391 QTC Calculation: 388 R Axis:   72 Text Interpretation:  Sinus rhythm No previous ECGs available Confirmed by Alvira MondaySchlossman, Trixie Maclaren ([id number]) on 07/17/2017 1:41:50 PM       Radiology No results found.  Procedures Procedures (including  critical care time)  Medications Ordered in ED Medications  dicyclomine (BENTYL) capsule 20 mg (20 mg Oral Given 07/17/17 1347)  pantoprazole (PROTONIX) EC tablet 40 mg (40 mg Oral Given 07/17/17 1347)     Initial Impression / Assessment and Plan / ED Course  I have reviewed the triage vital signs and the nursing notes.  Pertinent labs & imaging results that were available during my care of the patient were reviewed by me and considered in my medical decision making (see chart for details).    21 year old male presents with concern for 3 weeks of abdominal discomfort, bloating, and diarrhea.  He is well-appearing, with normal vital signs, and do not feel emergent labs are indicated.  Abdominal exam is benign, given duration of symptoms as well as exam, have low suspicion for acute cholecystitis appendicitis, diverticulitis, SBO.  Discussed with patient that while I do not see signs of surgical emergency, it is important he continue work up as an outpatient. Recommend outpt stool samples and other evaluation.  He did report he had some lightheadedness at times while exercising.  EKG was done which did not show evidence of delta waves or HOCM.  While this is likely secondary to patient exerting himself while dehydrated, recommended outpt follow up.  Given rx for bentyl and protonix. Patient discharged in stable condition with understanding of reasons to return.   Final Clinical Impressions(s) / ED Diagnoses   Final diagnoses:  Epigastric pain  Bloating  Diarrhea, unspecified type    ED Discharge Orders        Ordered    dicyclomine (BENTYL) 20 MG tablet  2 times daily     07/17/17 1333    pantoprazole (PROTONIX) 40 MG tablet  Daily     07/17/17 1333       Alvira MondaySchlossman, Rubin Dais, MD 07/18/17 1947

## 2017-07-17 NOTE — ED Triage Notes (Signed)
patient states that he was having abdominal pain and bloating x 2 -3 weeks. The patient reports that he has had intermittent loose stools. Denies any N/V or blood in stool

## 2017-11-07 ENCOUNTER — Encounter (HOSPITAL_BASED_OUTPATIENT_CLINIC_OR_DEPARTMENT_OTHER): Payer: Self-pay | Admitting: Emergency Medicine

## 2017-11-07 ENCOUNTER — Emergency Department (HOSPITAL_BASED_OUTPATIENT_CLINIC_OR_DEPARTMENT_OTHER): Payer: Self-pay

## 2017-11-07 ENCOUNTER — Emergency Department (HOSPITAL_BASED_OUTPATIENT_CLINIC_OR_DEPARTMENT_OTHER)
Admission: EM | Admit: 2017-11-07 | Discharge: 2017-11-07 | Disposition: A | Discharge status: Home / Self Care | Payer: Self-pay | Attending: Emergency Medicine | Admitting: Emergency Medicine

## 2017-11-07 ENCOUNTER — Other Ambulatory Visit: Payer: Self-pay

## 2017-11-07 DIAGNOSIS — F17200 Nicotine dependence, unspecified, uncomplicated: Secondary | ICD-10-CM | POA: Insufficient documentation

## 2017-11-07 DIAGNOSIS — J4541 Moderate persistent asthma with (acute) exacerbation: Secondary | ICD-10-CM | POA: Insufficient documentation

## 2017-11-07 DIAGNOSIS — E876 Hypokalemia: Secondary | ICD-10-CM | POA: Insufficient documentation

## 2017-11-07 DIAGNOSIS — R070 Pain in throat: Secondary | ICD-10-CM | POA: Insufficient documentation

## 2017-11-07 LAB — BASIC METABOLIC PANEL
ANION GAP: 10 (ref 5–15)
BUN: 7 mg/dL (ref 6–20)
CHLORIDE: 106 mmol/L (ref 101–111)
CO2: 22 mmol/L (ref 22–32)
Calcium: 8.5 mg/dL — ABNORMAL LOW (ref 8.9–10.3)
Creatinine, Ser: 1.08 mg/dL (ref 0.61–1.24)
GFR calc Af Amer: >60 mL/min (ref >60)
GFR calc non Af Amer: >60 mL/min (ref >60)
GLUCOSE: 163 mg/dL — AB (ref 65–99)
POTASSIUM: 2.6 mmol/L — AB (ref 3.5–5.1)
Sodium: 138 mmol/L (ref 135–145)

## 2017-11-07 LAB — CBC
HEMATOCRIT: 42.6 % (ref 39.0–52.0)
HEMOGLOBIN: 14.7 g/dL (ref 13.0–17.0)
MCH: 26.3 pg (ref 26.0–34.0)
MCHC: 34.5 g/dL (ref 30.0–36.0)
MCV: 76.2 fL — ABNORMAL LOW (ref 78.0–100.0)
Platelets: 95 10*3/uL — ABNORMAL LOW (ref 150–400)
RBC: 5.59 MIL/uL (ref 4.22–5.81)
RDW: 13.9 % (ref 11.5–15.5)
WBC: 6.9 10*3/uL (ref 4.0–10.5)

## 2017-11-07 LAB — RAPID STREP SCREEN (MED CTR MEBANE ONLY): STREPTOCOCCUS, GROUP A SCREEN (DIRECT): NEGATIVE

## 2017-11-07 MED ORDER — ALBUTEROL SULFATE (2.5 MG/3ML) 0.083% IN NEBU
5.0000 mg | INHALATION_SOLUTION | Freq: Once | RESPIRATORY_TRACT | Status: AC
  Administered 2017-11-07: 5 mg via RESPIRATORY_TRACT

## 2017-11-07 MED ORDER — IPRATROPIUM BROMIDE 0.02 % IN SOLN
0.5000 mg | Freq: Once | RESPIRATORY_TRACT | Status: AC
  Administered 2017-11-07: 0.5 mg via RESPIRATORY_TRACT

## 2017-11-07 MED ORDER — ALBUTEROL (5 MG/ML) CONTINUOUS INHALATION SOLN
10.0000 mg/h | INHALATION_SOLUTION | RESPIRATORY_TRACT | Status: DC
  Administered 2017-11-07: 10 mg/h via RESPIRATORY_TRACT

## 2017-11-07 MED ORDER — METHYLPREDNISOLONE SODIUM SUCC 125 MG IJ SOLR
125.0000 mg | Freq: Once | INTRAMUSCULAR | Status: AC
  Administered 2017-11-07: 125 mg via INTRAVENOUS
  Filled 2017-11-07: qty 2

## 2017-11-07 MED ORDER — IPRATROPIUM BROMIDE 0.02 % IN SOLN
RESPIRATORY_TRACT | Status: AC
  Filled 2017-11-07: qty 2.5

## 2017-11-07 MED ORDER — PREDNISONE 10 MG PO TABS: 40.0000 mg | ORAL_TABLET | Freq: Every day | ORAL | 0 refills | Status: DC

## 2017-11-07 MED ORDER — ALBUTEROL (5 MG/ML) CONTINUOUS INHALATION SOLN
INHALATION_SOLUTION | RESPIRATORY_TRACT | Status: AC
  Administered 2017-11-07: 10 mg/h via RESPIRATORY_TRACT
  Filled 2017-11-07: qty 20

## 2017-11-07 MED ORDER — SODIUM CHLORIDE 0.9 % IV SOLN
INTRAVENOUS | Status: DC
  Administered 2017-11-07: 08:00:00 via INTRAVENOUS

## 2017-11-07 MED ORDER — ALBUTEROL SULFATE (2.5 MG/3ML) 0.083% IN NEBU
INHALATION_SOLUTION | RESPIRATORY_TRACT | Status: AC
  Filled 2017-11-07: qty 3

## 2017-11-07 MED ORDER — POTASSIUM CHLORIDE CRYS ER 20 MEQ PO TBCR
40.0000 meq | EXTENDED_RELEASE_TABLET | Freq: Once | ORAL | Status: AC
  Administered 2017-11-07: 40 meq via ORAL
  Filled 2017-11-07: qty 2

## 2017-11-07 MED ORDER — ALBUTEROL SULFATE HFA 108 (90 BASE) MCG/ACT IN AERS
2.0000 | INHALATION_SPRAY | RESPIRATORY_TRACT | Status: DC | PRN
  Administered 2017-11-07: 2 via RESPIRATORY_TRACT
  Filled 2017-11-07: qty 6.7

## 2017-11-07 MED FILL — predniSONE 10 MG TABS: 10 | 5 days supply | Qty: 20 | Fill #0

## 2017-11-07 NOTE — ED Triage Notes (Signed)
Pt c/o shortness of breath and chest pain. Pt reports runny nose, sore throat and congestion x 1 week.

## 2017-11-07 NOTE — Discharge Instructions (Addendum)
Use the albuterol inhaler at least every 6 hours for the next 7 days.  Take the prednisone as directed.  Return for any new or worse symptoms.  Will need to make arrangements for follow-up to have your potassium rechecked.  Most likely it was down some due to the continuous nebulizer treatment.  But it still may be below normal.  List of foods high in potassium provided.

## 2017-11-07 NOTE — ED Provider Notes (Addendum)
[facility] EMERGENCY DEPARTMENT Provider Note   CSN: [id number] Arrival date & time: 11/07/17  [id number]     History   Chief Complaint Chief Complaint  Patient presents with  . Shortness of Breath    HPI Darren Finley is a 22 y.o. male.  Patient presents with shortness of breath and chest pain.  Also has runny nose sore throat and congestion for a week.  Patient has a history of allergy problems.  And also history of asthma.  Currently out of his inhaler.  The chest pain is with breathing and cough.  Patient with significant wheezing.  Was on an hour-long nebulizer before I saw him.     Past Medical History:  Diagnosis Date  . Asthma     There are no active problems to display for this patient.   History reviewed. No pertinent surgical history.      Home Medications    Prior to Admission medications   Medication Sig Start Date End Date Taking? Authorizing Provider  cetirizine (ZYRTEC) 10 MG tablet Take 10 mg by mouth once.   Yes [provider]  guaiFENesin (MUCINEX) 600 MG 12 hr tablet Take 600 mg by mouth once.   Yes [provider]  predniSONE (DELTASONE) 10 MG tablet Take 4 tablets (40 mg total) by mouth daily. 11/07/17   Vanetta MuldersZackowski, Plumer Mittelstaedt, MD    Family History No family history on file.  Social History Social History   Tobacco Use  . Smoking status: Current Every Day Smoker  . Smokeless tobacco: Never Used  Substance Use Topics  . Alcohol use: Yes    Comment: occ  . Drug use: Yes    Types: Marijuana     Allergies   Patient has no known allergies.   Review of Systems Review of Systems  Constitutional: Negative for fever.  HENT: Positive for congestion and sore throat.   Eyes: Negative for redness.  Respiratory: Positive for cough, shortness of breath and wheezing.   Gastrointestinal: Negative for abdominal pain.  Genitourinary: Negative for dysuria.  Musculoskeletal: Negative for back pain.  Skin: Negative for  rash.  Neurological: Negative for numbness.  Hematological: Does not bruise/bleed easily.  Psychiatric/Behavioral: Negative for confusion.     Physical Exam Updated Vital Signs BP (!) 145/84 (BP Location: Right Arm) Comment: Simultaneous filing. User may not have seen previous data.  Pulse (!) 112   Temp 98.8 F (37.1 C) (Oral)   Resp (!) 22   Ht 1.981 m (6\' 6" )   Wt 104.3 kg (230 lb)   SpO2 96%   BMI 26.58 kg/m   Physical Exam  Constitutional: He is oriented to person, place, and time. He appears well-developed and well-nourished. No distress.  HENT:  Head: Normocephalic and atraumatic.  Mouth/Throat: Oropharynx is clear and moist. No oropharyngeal exudate.  Eyes: Pupils are equal, round, and reactive to light. Conjunctivae and EOM are normal.  Neck: Normal range of motion. Neck supple.  Cardiovascular: Normal rate, regular rhythm and normal heart sounds.  Pulmonary/Chest: Effort normal. No respiratory distress. He has wheezes.  Abdominal: Soft. Bowel sounds are normal. There is no tenderness.  Musculoskeletal: Normal range of motion.  Neurological: He is alert and oriented to person, place, and time. No cranial nerve deficit or sensory deficit. He exhibits normal muscle tone. Coordination normal.  Skin: Skin is warm. No rash noted.  Nursing note and vitals reviewed.    ED Treatments / Results  Labs (all labs ordered are listed, but only  abnormal results are displayed) Labs Reviewed  CBC - Abnormal; Notable for the following components:      Result Value   MCV 76.2 (*)    Platelets 95 (*)    All other components within normal limits  BASIC METABOLIC PANEL - Abnormal; Notable for the following components:   Potassium 2.6 (*)    Glucose, Bld 163 (*)    Calcium 8.5 (*)    All other components within normal limits  RAPID STREP SCREEN ([facility] ONLY)  CULTURE, GROUP A STREP [facility])    EKG None  Radiology Dg Chest 2 View  Result Date: 11/07/2017 CLINICAL DATA:   22 year old male with sore throat chest pain shortness of breath and sinus pressure for 1 week. EXAM: CHEST - 2 VIEW COMPARISON:  Chest radiographs 06/12/2016 and earlier. FINDINGS: Lung volumes remain normal. Mediastinal contours remain normal. Visualized tracheal air column is within normal limits. No pneumothorax, pulmonary edema, pleural effusion or confluent pulmonary opacity. No osseous abnormality identified. Negative visible bowel gas pattern. IMPRESSION: Negative.  No acute cardiopulmonary abnormality. Electronically Signed   By: Odessa Fleming M.D.   On: 11/07/2017 07:55    Procedures Procedures (including critical care time)  CRITICAL CARE Performed by: Vanetta Mulders Total critical care time: 30 minutes Critical care time was exclusive of separately billable procedures and treating other patients. Critical care was necessary to treat or prevent imminent or life-threatening deterioration. Critical care was time spent personally by me on the following activities: development of treatment plan with patient and/or surrogate as well as nursing, discussions with consultants, evaluation of patient's response to treatment, examination of patient, obtaining history from patient or surrogate, ordering and performing treatments and interventions, ordering and review of laboratory studies, ordering and review of radiographic studies, pulse oximetry and re-evaluation of patient's condition.  Critical care time appropriate based on hour-long nebulizer requirement.  Patient arrived with significant respiratory distress and wheezing.   Medications Ordered in ED Medications  albuterol (PROVENTIL) (2.5 MG/3ML) 0.083% nebulizer solution (  Canceled Entry 11/07/17 0625)  ipratropium (ATROVENT) 0.02 % nebulizer solution (  Canceled Entry 11/07/17 0625)  albuterol (PROVENTIL HFA;VENTOLIN HFA) 108 (90 Base) MCG/ACT inhaler 2 puff (2 puffs Inhalation Given 11/07/17 0647)  albuterol (PROVENTIL,VENTOLIN) solution  continuous neb (10 mg/hr Nebulization New Bag/Given 11/07/17 0649)  0.9 %  sodium chloride infusion ( Intravenous New Bag/Given 11/07/17 0814)  potassium chloride SA (K-DUR,KLOR-CON) CR tablet 40 mEq (has no administration in time range)  albuterol (PROVENTIL) (2.5 MG/3ML) 0.083% nebulizer solution 5 mg (5 mg Nebulization Given 11/07/17 0623)  ipratropium (ATROVENT) nebulizer solution 0.5 mg (0.5 mg Nebulization Given 11/07/17 0623)  methylPREDNISolone sodium succinate (SOLU-MEDROL) 125 mg/2 mL injection 125 mg (125 mg Intravenous Given 11/07/17 0810)     Initial Impression / Assessment and Plan / ED Course  I have reviewed the triage vital signs and the nursing notes.  Pertinent labs & imaging results that were available during my care of the patient were reviewed by me and considered in my medical decision making (see chart for details).    Patient chest x-ray negative.  Patient did have persistent asthma.  But does following nebulizer treatments and steroids improved significantly.  Still had some wheezing at discharge but felt much better.  Patient also with some upper respiratory type symptoms for the past week.  Chest x-ray also showed no evidence of pneumonia.  Did have hypokalemia.  With a potassium of 2.6.  Patient received oral potassium here.  There is  also possible that the potassium was low secondary to the extensive nebulizer treatments.  Patient oxygen saturations on room air were in the low 90s at discharge.  Patient's strep test was negative.  He has had strep in the past but negative today.  Patient will be continued on prednisone.  Patient has albuterol inhalers.  Likely feel that his chest pain is are not cardiac in nature that all improved with improvement in the asthma exacerbation.   Final Clinical Impressions(s) / ED Diagnoses   Final diagnoses:  Moderate persistent asthma with exacerbation  Acute hypokalemia    ED Discharge Orders        Ordered    predniSONE  (DELTASONE) 10 MG tablet  Daily     11/07/17 1052       Vanetta Mulders, MD 11/15/17 4098    Vanetta Mulders, MD 11/28/17 [phone]

## 2017-11-09 LAB — CULTURE, GROUP A STREP (THRC)

## 2018-03-24 ENCOUNTER — Emergency Department (HOSPITAL_BASED_OUTPATIENT_CLINIC_OR_DEPARTMENT_OTHER)
Admission: EM | Admit: 2018-03-24 | Discharge: 2018-03-24 | Disposition: A | Discharge status: Home / Self Care | Payer: Medicaid Other | Attending: Emergency Medicine | Admitting: Emergency Medicine

## 2018-03-24 ENCOUNTER — Encounter (HOSPITAL_BASED_OUTPATIENT_CLINIC_OR_DEPARTMENT_OTHER): Payer: Self-pay | Admitting: *Deleted

## 2018-03-24 ENCOUNTER — Other Ambulatory Visit: Payer: Self-pay

## 2018-03-24 DIAGNOSIS — F17210 Nicotine dependence, cigarettes, uncomplicated: Secondary | ICD-10-CM | POA: Insufficient documentation

## 2018-03-24 DIAGNOSIS — J45901 Unspecified asthma with (acute) exacerbation: Secondary | ICD-10-CM | POA: Insufficient documentation

## 2018-03-24 MED ORDER — ALBUTEROL SULFATE (2.5 MG/3ML) 0.083% IN NEBU
2.5000 mg | INHALATION_SOLUTION | RESPIRATORY_TRACT | Status: AC
  Administered 2018-03-24: 2.5 mg via RESPIRATORY_TRACT
  Filled 2018-03-24: qty 3

## 2018-03-24 MED ORDER — IPRATROPIUM-ALBUTEROL 0.5-2.5 (3) MG/3ML IN SOLN
3.0000 mL | Freq: Four times a day (QID) | RESPIRATORY_TRACT | Status: DC
  Administered 2018-03-24: 3 mL via RESPIRATORY_TRACT
  Filled 2018-03-24: qty 3

## 2018-03-24 MED ORDER — PREDNISONE 50 MG PO TABS: 50.0000 mg | ORAL_TABLET | Freq: Every day | ORAL | 0 refills | Status: AC

## 2018-03-24 MED ORDER — PREDNISONE 50 MG PO TABS
50.0000 mg | ORAL_TABLET | Freq: Once | ORAL | Status: AC
Start: 2018-03-24 — End: 2018-03-24
  Administered 2018-03-24: 50 mg via ORAL
  Filled 2018-03-24: qty 1

## 2018-03-24 MED ORDER — ALBUTEROL SULFATE HFA 108 (90 BASE) MCG/ACT IN AERS
2.0000 | INHALATION_SPRAY | RESPIRATORY_TRACT | Status: DC | PRN
  Administered 2018-03-24: 2 via RESPIRATORY_TRACT
  Filled 2018-03-24: qty 6.7

## 2018-03-24 NOTE — ED Notes (Signed)
Pt given rx x 1 for prednisone

## 2018-03-24 NOTE — ED Notes (Signed)
This RT saw Pt in waiting room. Mild wheezing all fields. Pt stated he ran out of his inhaler.

## 2018-03-24 NOTE — Discharge Instructions (Signed)
Your story and exam today is consistent with an asthma attack.  Please use the steroids for the next 4 days as we discussed.  Please use the inhaler to help with your breathing.  Please follow-up with your primary doctor when you return from your trip.  If any symptoms change or worsen, please return to the nearest emergency department.

## 2018-03-24 NOTE — ED Notes (Signed)
Pt states that he is feeling better.

## 2018-03-24 NOTE — ED Provider Notes (Signed)
[facility] EMERGENCY DEPARTMENT Provider Note   CSN: [id number] Arrival date & time: 03/24/18  2102     History   Chief Complaint Chief Complaint  Patient presents with  . Asthma    HPI Darren Finley is a 22 y.o. male.  The history is provided by the patient and medical records. No language interpreter was used.  Wheezing   This is a recurrent problem. The current episode started more than 2 days ago. The problem occurs constantly. The problem has not changed since onset.Pertinent negatives include no chest pain, no fever, no abdominal pain, no vomiting, no diarrhea, no dysuria, no headaches, no rhinorrhea, no sore throat, no neck pain, no cough, no sputum production and no rash. It is unknown what precipitated the problem. He has tried nothing for the symptoms. He has had prior ED visits. His past medical history is significant for asthma.    Past Medical History:  Diagnosis Date  . Asthma     There are no active problems to display for this patient.   History reviewed. No pertinent surgical history.      Home Medications    Prior to Admission medications   Medication Sig Start Date End Date Taking? Authorizing Provider  cetirizine (ZYRTEC) 10 MG tablet Take 10 mg by mouth once.   Yes [provider]  guaiFENesin (MUCINEX) 600 MG 12 hr tablet Take 600 mg by mouth once.    [provider]  predniSONE (DELTASONE) 10 MG tablet Take 4 tablets (40 mg total) by mouth daily. 11/07/17   Vanetta Mulders, MD    Family History No family history on file.  Social History Social History   Tobacco Use  . Smoking status: Current Every Day Smoker    Types: Cigarettes  . Smokeless tobacco: Never Used  Substance Use Topics  . Alcohol use: Yes    Comment: occ  . Drug use: Yes    Types: Marijuana     Allergies   Patient has no known allergies.   Review of Systems Review of Systems  Constitutional: Negative for activity change, chills,  diaphoresis, fatigue and fever.  HENT: Negative for congestion, rhinorrhea and sore throat.   Eyes: Negative for visual disturbance.  Respiratory: Positive for chest tightness, shortness of breath and wheezing. Negative for cough, sputum production, choking and stridor.   Cardiovascular: Negative for chest pain, palpitations and leg swelling.  Gastrointestinal: Negative for abdominal distention, abdominal pain, blood in stool, constipation, diarrhea, nausea and vomiting.  Genitourinary: Negative for difficulty urinating, dysuria, flank pain and frequency.  Musculoskeletal: Negative for back pain, gait problem and neck pain.  Skin: Negative for rash and wound.  Neurological: Negative for dizziness, weakness, light-headedness and headaches.  Psychiatric/Behavioral: Negative for agitation.  All other systems reviewed and are negative.    Physical Exam Updated Vital Signs BP 127/79 (BP Location: Left Arm)   Pulse 65   Temp 98.2 F (36.8 C) (Oral)   Resp 20   Ht 6\' 6"  (1.981 m)   Wt 106.6 kg   SpO2 99%   BMI 27.16 kg/m   Physical Exam  Constitutional: He is oriented to person, place, and time. He appears well-developed and well-nourished. No distress.  HENT:  Head: Normocephalic and atraumatic.  Nose: Nose normal.  Mouth/Throat: Oropharynx is clear and moist. No oropharyngeal exudate.  Eyes: Pupils are equal, round, and reactive to light. Conjunctivae and EOM are normal.  Neck: Normal range of motion. Neck supple.  Cardiovascular: Normal rate and  regular rhythm.  No murmur heard. Pulmonary/Chest: Effort normal. No respiratory distress. He has wheezes. He has no rales. He exhibits no tenderness.  Abdominal: Soft. There is no tenderness.  Musculoskeletal: He exhibits no edema or tenderness.  Neurological: He is alert and oriented to person, place, and time.  Skin: Skin is warm and dry. Capillary refill takes less than 2 seconds. He is not diaphoretic. No erythema. No pallor.    Psychiatric: He has a normal mood and affect.  Nursing note and vitals reviewed.    ED Treatments / Results  Labs (all labs ordered are listed, but only abnormal results are displayed) Labs Reviewed - No data to display  EKG EKG Interpretation  Date/Time:  Sunday March 24 2018 21:19:46 EDT Ventricular Rate:  71 PR Interval:  162 QRS Duration: 92 QT Interval:  372 QTC Calculation: 404 R Axis:   83 Text Interpretation:  Normal sinus rhythm Normal ECG When comapred to prior, no signifiant changes seen.  No STEMI Confirmed by Theda Belfast ([id number]) on 03/24/2018 9:38:11 PM   Radiology No results found.  Procedures Procedures (including critical care time)  Medications Ordered in ED Medications  ipratropium-albuterol (DUONEB) 0.5-2.5 (3) MG/3ML nebulizer solution 3 mL (3 mLs Nebulization Given 03/24/18 2134)  albuterol (PROVENTIL HFA;VENTOLIN HFA) 108 (90 Base) MCG/ACT inhaler 2 puff (2 puffs Inhalation Given 03/24/18 2134)  albuterol (PROVENTIL) (2.5 MG/3ML) 0.083% nebulizer solution 2.5 mg (2.5 mg Nebulization Given 03/24/18 2134)  predniSONE (DELTASONE) tablet 50 mg (50 mg Oral Given 03/24/18 2215)     Initial Impression / Assessment and Plan / ED Course  I have reviewed the triage vital signs and the nursing notes.  Pertinent labs & imaging results that were available during my care of the patient were reviewed by me and considered in my medical decision making (see chart for details).    Darren Finley is a 21 y.o. male with a past medical history significant for asthma who presents with wheezing.  Patient reports that for the last week he has been out of his inhaler and has had wheezing.  He reports some shortness of breath and chest tightness with it.  He denies fevers, chills, congestion, or cough.  He denies recent trauma.  He denies any leg pain, leg swelling, or history of DVT/PE.  He denies other complaints.  Next  On exam, patient had wheezing in all lung fields on  arrival.  Patient was started on a nebulized breathing treatment.  Patient had some improvement after breathing treatment.  Chest was nontender and abdomen was nontender.  Patient otherwise appears well.  Exam otherwise unremarkable.  Based on exam, I have less concern for pneumonia, cardiac cause, or PE.  Suspect asthma exacerbation given absence of albuterol at home.  Patient given another inhaler which she used and had further improvement in his breathing.  Wheezing resolved on reexamination.  Suspect mild asthma exacerbation.  Patient given dose of prednisone and will take a burst of prednisone for the next several days.  Of note, patient is leaving the country tomorrow and was given instructions on using his inhaler and how to watch for worsening exacerbation.  Patient understands return precautions and follow-up instructions.  No other questions or concerns and patient was discharged in good condition with resolution of her wheezing and shortness of breath.      Final Clinical Impressions(s) / ED Diagnoses   Final diagnoses:  Mild asthma with exacerbation, unspecified whether persistent    ED Discharge Orders  Ordered    predniSONE (DELTASONE) 50 MG tablet  Daily     03/24/18 2234         Clinical Impression: 1. Mild asthma with exacerbation, unspecified whether persistent     Disposition: Discharge  Condition: Good  I have discussed the results, Dx and Tx plan with the pt(& family if present). He/she/they expressed understanding and agree(s) with the plan. Discharge instructions discussed at great length. Strict return precautions discussed and pt &/or family have verbalized understanding of the instructions. No further questions at time of discharge.    Discharge Medication List as of 03/24/2018 10:35 PM    START taking these medications   Details  !! predniSONE (DELTASONE) 50 MG tablet Take 1 tablet (50 mg total) by mouth daily for 4 days., Starting Mon 03/25/2018,  Until Fri 03/29/2018, Print     !! - Potential duplicate medications found. Please discuss with provider.      Follow Up: [facility] [address] [id number] mc [place] [address] [phone]    [facility] [address] [place] [address] [phone] Schedule an appointment as soon as possible for a visit       Dynastie Knoop, Canary Brim, MD 03/25/18 [phone]

## 2018-03-24 NOTE — ED Triage Notes (Signed)
Pt reports chest pain and sob since Tuesday . Pt states he has a hx of asthma but ran out of his inhaler last month.

## 2018-04-27 ENCOUNTER — Emergency Department (HOSPITAL_BASED_OUTPATIENT_CLINIC_OR_DEPARTMENT_OTHER)
Admission: EM | Admit: 2018-04-27 | Discharge: 2018-04-27 | Disposition: A | Discharge status: Home / Self Care | Payer: Self-pay | Attending: Emergency Medicine | Admitting: Emergency Medicine

## 2018-04-27 ENCOUNTER — Encounter (HOSPITAL_BASED_OUTPATIENT_CLINIC_OR_DEPARTMENT_OTHER): Payer: Self-pay | Admitting: *Deleted

## 2018-04-27 ENCOUNTER — Emergency Department (HOSPITAL_BASED_OUTPATIENT_CLINIC_OR_DEPARTMENT_OTHER): Payer: Self-pay

## 2018-04-27 ENCOUNTER — Other Ambulatory Visit: Payer: Self-pay

## 2018-04-27 DIAGNOSIS — J45909 Unspecified asthma, uncomplicated: Secondary | ICD-10-CM | POA: Insufficient documentation

## 2018-04-27 DIAGNOSIS — J069 Acute upper respiratory infection, unspecified: Secondary | ICD-10-CM | POA: Insufficient documentation

## 2018-04-27 DIAGNOSIS — Z79899 Other long term (current) drug therapy: Secondary | ICD-10-CM | POA: Insufficient documentation

## 2018-04-27 DIAGNOSIS — F17210 Nicotine dependence, cigarettes, uncomplicated: Secondary | ICD-10-CM | POA: Insufficient documentation

## 2018-04-27 DIAGNOSIS — A09 Infectious gastroenteritis and colitis, unspecified: Secondary | ICD-10-CM

## 2018-04-27 DIAGNOSIS — R197 Diarrhea, unspecified: Secondary | ICD-10-CM | POA: Insufficient documentation

## 2018-04-27 LAB — COMPREHENSIVE METABOLIC PANEL
ALT: 19 U/L (ref 0–44)
AST: 19 U/L (ref 15–41)
Albumin: 4.3 g/dL (ref 3.5–5.0)
Alkaline Phosphatase: 53 U/L (ref 38–126)
Anion gap: 11 (ref 5–15)
BUN: 9 mg/dL (ref 6–20)
CO2: 23 mmol/L (ref 22–32)
Calcium: 9.4 mg/dL (ref 8.9–10.3)
Chloride: 107 mmol/L (ref 98–111)
Creatinine, Ser: 0.91 mg/dL (ref 0.61–1.24)
GFR calc Af Amer: >60 mL/min (ref >60)
GFR calc non Af Amer: >60 mL/min (ref >60)
Glucose, Bld: 93 mg/dL (ref 70–99)
Potassium: 3.5 mmol/L (ref 3.5–5.1)
Sodium: 141 mmol/L (ref 135–145)
Total Bilirubin: 1.9 mg/dL — ABNORMAL HIGH (ref 0.3–1.2)
Total Protein: 7.5 g/dL (ref 6.5–8.1)

## 2018-04-27 LAB — CBC WITH DIFFERENTIAL/PLATELET
Basophils Absolute: 0 10*3/uL (ref 0.0–0.1)
Basophils Relative: 0 %
Eosinophils Absolute: 0.1 10*3/uL (ref 0.0–0.7)
Eosinophils Relative: 1 %
HCT: 46 % (ref 39.0–52.0)
Hemoglobin: 15.6 g/dL (ref 13.0–17.0)
Lymphocytes Relative: 27 %
Lymphs Abs: 1.6 10*3/uL (ref 0.7–4.0)
MCH: 25.9 pg — ABNORMAL LOW (ref 26.0–34.0)
MCHC: 33.9 g/dL (ref 30.0–36.0)
MCV: 76.4 fL — ABNORMAL LOW (ref 78.0–100.0)
Monocytes Absolute: 0.6 10*3/uL (ref 0.1–1.0)
Monocytes Relative: 11 %
Neutro Abs: 3.7 10*3/uL (ref 1.7–7.7)
Neutrophils Relative %: 61 %
Platelets: 132 10*3/uL — ABNORMAL LOW (ref 150–400)
RBC: 6.02 MIL/uL — ABNORMAL HIGH (ref 4.22–5.81)
RDW: 13.3 % (ref 11.5–15.5)
WBC: 6 10*3/uL (ref 4.0–10.5)

## 2018-04-27 LAB — LIPASE, BLOOD: Lipase: 22 U/L (ref 11–51)

## 2018-04-27 MED ORDER — FLUTICASONE PROPIONATE 50 MCG/ACT NA SUSP: 2.0000 | Freq: Every day | NASAL | 0 refills | Status: DC

## 2018-04-27 MED ORDER — CIPROFLOXACIN HCL 500 MG PO TABS: 500.0000 mg | ORAL_TABLET | Freq: Two times a day (BID) | ORAL | 0 refills | Status: DC

## 2018-04-27 MED ORDER — BENZONATATE 100 MG PO CAPS: 100.0000 mg | ORAL_CAPSULE | Freq: Three times a day (TID) | ORAL | 0 refills | Status: DC

## 2018-04-27 NOTE — ED Provider Notes (Signed)
[facility] EMERGENCY DEPARTMENT Provider Note   CSN: [id number] Arrival date & time: 04/27/18  1355     History   Chief Complaint Chief Complaint  Patient presents with  . Nasal Congestion    HPI Darren Finley is a 22 y.o. male with history of asthma who presents with a 5-day history of nasal congestion, cough, nausea and a 1 month history of diarrhea.  Patient reports he has been in [place] for the past month and return 5 days ago.  He reports that he got back he began sneezing and having nasal congestion.  He then began having productive cough.  He denies any chest pain or shortness of breath.  He has had 3 episodes of nonbloody diarrhea daily for the past month.  He reports abdominal cramping prior, but otherwise no abdominal pain.  He denies any dizziness or lightheadedness.  He has been taking Zyrtec and DayQuil at home without relief.  He has not noticed any fevers at home.  HPI  Past Medical History:  Diagnosis Date  . Asthma     There are no active problems to display for this patient.   History reviewed. No pertinent surgical history.      Home Medications    Prior to Admission medications   Medication Sig Start Date End Date Taking? Authorizing Provider  benzonatate (TESSALON) 100 MG capsule Take 1 capsule (100 mg total) by mouth every 8 (eight) hours. 04/27/18   Donesha Wallander, Waylan Boga, PA-C  cetirizine (ZYRTEC) 10 MG tablet Take 10 mg by mouth once.    [provider]  ciprofloxacin (CIPRO) 500 MG tablet Take 1 tablet (500 mg total) by mouth every 12 (twelve) hours. 04/27/18   Julena Barbour, Waylan Boga, PA-C  fluticasone (FLONASE) 50 MCG/ACT nasal spray Place 2 sprays into both nostrils daily. 04/27/18   Racquel Arkin, Waylan Boga, PA-C  guaiFENesin (MUCINEX) 600 MG 12 hr tablet Take 600 mg by mouth once.    [provider]  predniSONE (DELTASONE) 10 MG tablet Take 4 tablets (40 mg total) by mouth daily. 11/07/17   Vanetta Mulders, MD    Family History No  family history on file.  Social History Social History   Tobacco Use  . Smoking status: Current Every Day Smoker    Types: Cigarettes  . Smokeless tobacco: Never Used  Substance Use Topics  . Alcohol use: Yes    Comment: occ  . Drug use: Yes    Types: Marijuana     Allergies   Patient has no known allergies.   Review of Systems Review of Systems  Constitutional: Negative for chills and fever.  HENT: Positive for congestion and sore throat. Negative for ear pain and facial swelling.   Respiratory: Negative for shortness of breath.   Cardiovascular: Negative for chest pain.  Gastrointestinal: Positive for diarrhea and nausea. Negative for abdominal pain and vomiting.  Genitourinary: Negative for dysuria.  Musculoskeletal: Negative for back pain.  Skin: Negative for rash and wound.  Neurological: Negative for headaches.  Psychiatric/Behavioral: The patient is not nervous/anxious.      Physical Exam Updated Vital Signs BP 126/83 (BP Location: Left Arm)   Pulse 70   Temp 98.2 F (36.8 C) (Oral)   Resp 18   Ht 6' 6.5" (1.994 m)   Wt 108.9 kg   SpO2 100%   BMI 27.38 kg/m   Physical Exam  Constitutional: He appears well-developed and well-nourished. No distress.  HENT:  Head: Normocephalic and atraumatic.  Right Ear: Tympanic  membrane normal.  Left Ear: Tympanic membrane normal.  Mouth/Throat: Mucous membranes are normal. No oropharyngeal exudate, posterior oropharyngeal edema, posterior oropharyngeal erythema or tonsillar abscesses.  Eyes: Pupils are equal, round, and reactive to light. Conjunctivae are normal. Right eye exhibits no discharge. Left eye exhibits no discharge. No scleral icterus.  Neck: Normal range of motion. Neck supple. No thyromegaly present.  Cardiovascular: Normal rate, regular rhythm and normal heart sounds. Exam reveals no gallop and no friction rub.  No murmur heard. Pulmonary/Chest: Effort normal and breath sounds normal. No stridor. No  respiratory distress. He has no wheezes. He has no rales.  Abdominal: Soft. Bowel sounds are normal. He exhibits no distension. There is no tenderness. There is no rebound and no guarding.  Musculoskeletal: He exhibits no edema.  Lymphadenopathy:    He has no cervical adenopathy.  Neurological: He is alert. Coordination normal.  Skin: Skin is warm and dry. No rash noted. He is not diaphoretic. No pallor.  Psychiatric: He has a normal mood and affect.  Nursing note and vitals reviewed.    ED Treatments / Results  Labs (all labs ordered are listed, but only abnormal results are displayed) Labs Reviewed  COMPREHENSIVE METABOLIC PANEL - Abnormal; Notable for the following components:      Result Value   Total Bilirubin 1.9 (*)    All other components within normal limits  CBC WITH DIFFERENTIAL/PLATELET - Abnormal; Notable for the following components:   RBC 6.02 (*)    MCV 76.4 (*)    MCH 25.9 (*)    Platelets 132 (*)    All other components within normal limits  LIPASE, BLOOD    EKG None  Radiology Dg Chest 2 View  Result Date: 04/27/2018 CLINICAL DATA:  Cough EXAM: CHEST - 2 VIEW COMPARISON:  11/07/2017 FINDINGS: Heart and mediastinal contours are within normal limits. No focal opacities or effusions. No acute bony abnormality. IMPRESSION: No active cardiopulmonary disease. Electronically Signed   By: Charlett Nose M.D.   On: 04/27/2018 14:41    Procedures Procedures (including critical care time)  Medications Ordered in ED Medications - No data to display   Initial Impression / Assessment and Plan / ED Course  I have reviewed the triage vital signs and the nursing notes.  Pertinent labs & imaging results that were available during my care of the patient were reviewed by me and considered in my medical decision making (see chart for details).     Patient with 1 month history of diarrhea while traveling to [place].  Patient also with URI symptoms versus allergic  symptoms.  Patient is afebrile and well-appearing.  Labs are unremarkable except for a mild elevation in total bilirubin, 1.9 and mild thrombocytopenia, 132K.  Chest x-ray is negative.  Will treat empirically with Cipro considering in 1 month of symptoms.  Patient has no abdominal tenderness.  Will also treat supportively with Flonase, Tessalon, continue Zyrtec.  Return precautions discussed.  Patient understands and agrees with plan.  Patient vitals stable throughout ED course and discharged in satisfactory condition.  Final Clinical Impressions(s) / ED Diagnoses   Final diagnoses:  Upper respiratory tract infection, unspecified type  Traveler's diarrhea    ED Discharge Orders         Ordered    ciprofloxacin (CIPRO) 500 MG tablet  Every 12 hours     04/27/18 1530    fluticasone (FLONASE) 50 MCG/ACT nasal spray  Daily     04/27/18 1530    benzonatate (TESSALON)  100 MG capsule  Every 8 hours     04/27/18 1530           Greer Koeppen, [place], PA-C 04/27/18 1532    Rolan Bucco, MD 04/28/18 [phone]

## 2018-04-27 NOTE — Discharge Instructions (Signed)
Medications: Cipro, Tessalon, Flonase  Treatment: Take Cipro until completed. Take Tessalon every 8 hours for cough as needed.  Take Flonase once daily for nasal congestion.  Continue taking Zyrtec as prescribed over-the-counter.  Make sure to stay well-hydrated.  Follow-up: Please return the emergency department if you develop any new or worsening symptoms.  Your platelets were little low today.  Please tell your doctor about this.  If you do not have a primary care provider, you can call the number outlined in black below.

## 2018-04-27 NOTE — ED Triage Notes (Signed)
Pt recently in [place] for one month. Returned on Tuesday. States he was not ill while traveling but Sx started after arriving in [place]. Reports sinus congestion, chest congestion and loose BM since his return

## 2018-05-24 ENCOUNTER — Emergency Department (HOSPITAL_COMMUNITY)
Admission: EM | Admit: 2018-05-24 | Discharge: 2018-05-24 | Disposition: A | Discharge status: Home / Self Care | Payer: Self-pay | Attending: Emergency Medicine | Admitting: Emergency Medicine

## 2018-05-24 ENCOUNTER — Emergency Department (HOSPITAL_COMMUNITY): Payer: Self-pay

## 2018-05-24 DIAGNOSIS — Y999 Unspecified external cause status: Secondary | ICD-10-CM | POA: Insufficient documentation

## 2018-05-24 DIAGNOSIS — F17210 Nicotine dependence, cigarettes, uncomplicated: Secondary | ICD-10-CM | POA: Insufficient documentation

## 2018-05-24 DIAGNOSIS — Y939 Activity, unspecified: Secondary | ICD-10-CM | POA: Insufficient documentation

## 2018-05-24 DIAGNOSIS — S0182XA Laceration with foreign body of other part of head, initial encounter: Secondary | ICD-10-CM | POA: Insufficient documentation

## 2018-05-24 DIAGNOSIS — F1290 Cannabis use, unspecified, uncomplicated: Secondary | ICD-10-CM | POA: Insufficient documentation

## 2018-05-24 DIAGNOSIS — R51 Headache: Secondary | ICD-10-CM | POA: Insufficient documentation

## 2018-05-24 DIAGNOSIS — S0181XA Laceration without foreign body of other part of head, initial encounter: Secondary | ICD-10-CM

## 2018-05-24 DIAGNOSIS — Z23 Encounter for immunization: Secondary | ICD-10-CM | POA: Insufficient documentation

## 2018-05-24 DIAGNOSIS — Y929 Unspecified place or not applicable: Secondary | ICD-10-CM | POA: Insufficient documentation

## 2018-05-24 MED ORDER — IBUPROFEN 600 MG PO TABS: 600.0000 mg | ORAL_TABLET | Freq: Four times a day (QID) | ORAL | 0 refills | Status: DC | PRN

## 2018-05-24 MED ORDER — BACITRACIN ZINC 500 UNIT/GM EX OINT: 1.0000 "application " | TOPICAL_OINTMENT | Freq: Every day | CUTANEOUS | 0 refills | Status: DC

## 2018-05-24 MED ORDER — BACITRACIN ZINC 500 UNIT/GM EX OINT
TOPICAL_OINTMENT | CUTANEOUS | Status: AC
  Filled 2018-05-24: qty 0.9

## 2018-05-24 MED ORDER — METHOCARBAMOL 750 MG PO TABS: 750.0000 mg | ORAL_TABLET | Freq: Two times a day (BID) | ORAL | 0 refills | Status: DC

## 2018-05-24 MED ORDER — ACETAMINOPHEN 500 MG PO TABS: 500.0000 mg | ORAL_TABLET | Freq: Four times a day (QID) | ORAL | 0 refills | Status: DC | PRN

## 2018-05-24 MED ORDER — TETANUS-DIPHTH-ACELL PERTUSSIS 5-2.5-18.5 LF-MCG/0.5 IM SUSP
0.5000 mL | Freq: Once | INTRAMUSCULAR | Status: AC
  Administered 2018-05-24: 0.5 mL via INTRAMUSCULAR
  Filled 2018-05-24: qty 0.5
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
## 2018-06-27 ENCOUNTER — Other Ambulatory Visit: Payer: Self-pay

## 2018-06-27 ENCOUNTER — Emergency Department (HOSPITAL_BASED_OUTPATIENT_CLINIC_OR_DEPARTMENT_OTHER): Payer: Self-pay

## 2018-06-27 ENCOUNTER — Encounter (HOSPITAL_BASED_OUTPATIENT_CLINIC_OR_DEPARTMENT_OTHER): Payer: Self-pay

## 2018-06-27 ENCOUNTER — Emergency Department (HOSPITAL_BASED_OUTPATIENT_CLINIC_OR_DEPARTMENT_OTHER)
Admission: EM | Admit: 2018-06-27 | Discharge: 2018-06-28 | Disposition: A | Discharge status: Home / Self Care | Payer: Self-pay | Attending: Emergency Medicine | Admitting: Emergency Medicine

## 2018-06-27 DIAGNOSIS — R062 Wheezing: Secondary | ICD-10-CM | POA: Insufficient documentation

## 2018-06-27 DIAGNOSIS — J45909 Unspecified asthma, uncomplicated: Secondary | ICD-10-CM | POA: Insufficient documentation

## 2018-06-27 DIAGNOSIS — Z79899 Other long term (current) drug therapy: Secondary | ICD-10-CM | POA: Insufficient documentation

## 2018-06-27 DIAGNOSIS — R059 Cough, unspecified: Secondary | ICD-10-CM

## 2018-06-27 DIAGNOSIS — R05 Cough: Secondary | ICD-10-CM | POA: Insufficient documentation

## 2018-06-27 MED ORDER — PREDNISONE 50 MG PO TABS
60.0000 mg | ORAL_TABLET | Freq: Once | ORAL | Status: AC
  Administered 2018-06-28: 60 mg via ORAL
  Filled 2018-06-27: qty 1

## 2018-06-27 MED ORDER — IPRATROPIUM-ALBUTEROL 0.5-2.5 (3) MG/3ML IN SOLN
3.0000 mL | Freq: Once | RESPIRATORY_TRACT | Status: AC
  Administered 2018-06-27: 3 mL via RESPIRATORY_TRACT
  Filled 2018-06-27: qty 3

## 2018-06-27 MED ORDER — ALBUTEROL SULFATE HFA 108 (90 BASE) MCG/ACT IN AERS
2.0000 | INHALATION_SPRAY | Freq: Once | RESPIRATORY_TRACT | Status: AC
  Administered 2018-06-27: 2 via RESPIRATORY_TRACT
  Filled 2018-06-27: qty 6.7

## 2018-06-27 MED ORDER — ALBUTEROL SULFATE (2.5 MG/3ML) 0.083% IN NEBU
2.5000 mg | INHALATION_SOLUTION | Freq: Once | RESPIRATORY_TRACT | Status: AC
  Administered 2018-06-27: 2.5 mg via RESPIRATORY_TRACT
  Filled 2018-06-27: qty 3

## 2018-06-27 MED ORDER — IPRATROPIUM-ALBUTEROL 0.5-2.5 (3) MG/3ML IN SOLN
3.0000 mL | Freq: Four times a day (QID) | RESPIRATORY_TRACT | Status: DC
  Administered 2018-06-27: 3 mL via RESPIRATORY_TRACT
  Filled 2018-06-27: qty 3

## 2018-06-27 NOTE — ED Notes (Signed)
Patient transported to X-ray 

## 2018-06-27 NOTE — ED Provider Notes (Signed)
[facility] EMERGENCY DEPARTMENT Provider Note   CSN: [id number] Arrival date & time: 06/27/18  2222     History   Chief Complaint Chief Complaint  Patient presents with  . Cough    HPI Darren Finley is a 22 y.o. male.  22 y.o male with a PMH of asthma presents to the ED with a chief complaint of shortness of breath and cough x 3 weeks. Patient reports the cough as dry "sounding like an old man coughing". He also reports some chills at home but denies any fever. He has tried using his inhaler and reports improvement in symptoms. Patient does not have a primary physician who follows him for his asthma.Patient denies any previous hospitalizations due to asthma exacerbation. He denies any fever, chest pain or other complaints.      Past Medical History:  Diagnosis Date  . Asthma     There are no active problems to display for this patient.   History reviewed. No pertinent surgical history.      Home Medications    Prior to Admission medications   Medication Sig Start Date End Date Taking? Authorizing Provider  acetaminophen (TYLENOL) 500 MG tablet Take 1 tablet (500 mg total) by mouth every 6 (six) hours as needed. 05/24/18   Law, Waylan Boga, PA-C  bacitracin ointment Apply 1 application topically daily. To all wounds. 05/24/18   Law, Waylan Boga, PA-C  benzonatate (TESSALON) 100 MG capsule Take 1 capsule (100 mg total) by mouth every 8 (eight) hours. Patient not taking: Reported on 05/24/2018 04/27/18   Emi Holes, PA-C  ciprofloxacin (CIPRO) 500 MG tablet Take 1 tablet (500 mg total) by mouth every 12 (twelve) hours. Patient not taking: Reported on 05/24/2018 04/27/18   Emi Holes, PA-C  fluticasone (FLONASE) 50 MCG/ACT nasal spray Place 2 sprays into both nostrils daily. Patient not taking: Reported on 05/24/2018 04/27/18   Emi Holes, PA-C  ibuprofen (ADVIL,MOTRIN) 600 MG tablet Take 1 tablet (600 mg total) by mouth every 6 (six) hours as  needed. 05/24/18   Law, Waylan Boga, PA-C  methocarbamol (ROBAXIN) 750 MG tablet Take 1 tablet (750 mg total) by mouth 2 (two) times daily. 05/24/18   Law, Waylan Boga, PA-C  predniSONE (DELTASONE) 20 MG tablet Take 2 tablets (40 mg total) by mouth daily for 5 days. 06/28/18 07/03/18  Claude Manges, PA-C    Family History No family history on file.  Social History Social History   Tobacco Use  . Smoking status: Former Smoker    Types: Cigarettes  . Smokeless tobacco: Never Used  Substance Use Topics  . Alcohol use: Yes    Comment: occ  . Drug use: Not Currently    Types: Marijuana     Allergies   Patient has no known allergies.   Review of Systems Review of Systems  Constitutional: Positive for chills. Negative for fever.  Respiratory: Positive for cough, shortness of breath and wheezing.      Physical Exam Updated Vital Signs BP 133/80 (BP Location: Right Arm)   Pulse 68   Temp 98.7 F (37.1 C) (Oral)   Resp 18   Ht 6\' 6"  (1.981 m)   Wt 111.1 kg   SpO2 96%   BMI 28.31 kg/m   Physical Exam  Constitutional: He is oriented to person, place, and time. He appears well-developed and well-nourished.  HENT:  Head: Normocephalic and atraumatic.  Mouth/Throat: Uvula is midline. Posterior oropharyngeal erythema present. No posterior oropharyngeal edema.  Eyes: Pupils are equal, round, and reactive to light. No scleral icterus.  Neck: Normal range of motion.  Cardiovascular: Normal heart sounds.  Pulmonary/Chest: Effort normal. He has wheezes in the right middle field, the right lower field, the left middle field and the left lower field. He exhibits no tenderness.  Abdominal: Soft. Bowel sounds are normal. He exhibits no distension. There is no tenderness.  Musculoskeletal: He exhibits no tenderness or deformity.  Neurological: He is alert and oriented to person, place, and time.  Skin: Skin is warm and dry.  Nursing note and vitals reviewed.    ED Treatments /  Results  Labs (all labs ordered are listed, but only abnormal results are displayed) Labs Reviewed - No data to display  EKG None  Radiology Dg Chest 2 View  Result Date: 06/28/2018 CLINICAL DATA:  Shortness of breath, wheezing, cough EXAM: CHEST - 2 VIEW COMPARISON:  04/27/2018 FINDINGS: Heart and mediastinal contours are within normal limits. No focal opacities or effusions. No acute bony abnormality. IMPRESSION: No active cardiopulmonary disease. Electronically Signed   By: Charlett NoseKevin  Dover M.D.   On: 06/28/2018 00:14    Procedures Procedures (including critical care time)  Medications Ordered in ED Medications  ipratropium-albuterol (DUONEB) 0.5-2.5 (3) MG/3ML nebulizer solution 3 mL (3 mLs Nebulization Given 06/27/18 2334)  ipratropium-albuterol (DUONEB) 0.5-2.5 (3) MG/3ML nebulizer solution 3 mL (3 mLs Nebulization Given 06/27/18 2243)  albuterol (PROVENTIL) (2.5 MG/3ML) 0.083% nebulizer solution 2.5 mg (2.5 mg Nebulization Given 06/27/18 2243)  albuterol (PROVENTIL HFA;VENTOLIN HFA) 108 (90 Base) MCG/ACT inhaler 2 puff (2 puffs Inhalation Given 06/27/18 2333)  predniSONE (DELTASONE) tablet 60 mg (60 mg Oral Given 06/28/18 0003)     Initial Impression / Assessment and Plan / ED Course  I have reviewed the triage vital signs and the nursing notes.  Pertinent labs & imaging results that were available during my care of the patient were reviewed by me and considered in my medical decision making (see chart for details).    Presents with cough, wheezing x3 weeks.  Patient has used his inhaler but states no relieving symptoms.  Patient does not currently follow with any primary care physician.  Will provide patient with breathing treatment he had some expiratory and inspiratory wheezing.  Reassess after due to patient's symptoms going on for more than 3 weeks we will also obtain a chest x-ray to rule out any pneumonia.  Will also provide patient with some prednisone while in the ED along  with send him home with a steroid burst.  DG Chest xray showed: No active cardiopulmonary disease.  Final Clinical Impressions(s) / ED Diagnoses   Final diagnoses:  Cough  Wheezing    ED Discharge Orders         Ordered    predniSONE (DELTASONE) 20 MG tablet  Daily     06/28/18 0004           Claude MangesSoto, Lummie Montijo, PA-C 06/28/18 0019    Mesner, Barbara CowerJason, MD 06/28/18 [id number]

## 2018-06-27 NOTE — ED Triage Notes (Signed)
C/o flu like sx x 3 weeks-SOB, wheezing x 2 days-NAD-steady gait

## 2018-06-28 MED ORDER — PREDNISONE 20 MG PO TABS: 40.0000 mg | ORAL_TABLET | Freq: Every day | ORAL | 0 refills | Status: DC

## 2018-06-28 MED ORDER — PREDNISONE 20 MG PO TABS: 40.0000 mg | ORAL_TABLET | Freq: Every day | ORAL | 0 refills | Status: AC

## 2018-06-28 NOTE — Discharge Instructions (Addendum)
I have prescribed steroids, be advised this medication can cause insomnia, appetite changes.  I have also provided a referral to [facility] please schedule an appointment to establish care with a primary care physician and further your asthma management.

## 2018-08-25 ENCOUNTER — Other Ambulatory Visit: Payer: Self-pay

## 2018-08-25 ENCOUNTER — Encounter (HOSPITAL_BASED_OUTPATIENT_CLINIC_OR_DEPARTMENT_OTHER): Payer: Self-pay | Admitting: Emergency Medicine

## 2018-08-25 ENCOUNTER — Emergency Department (HOSPITAL_BASED_OUTPATIENT_CLINIC_OR_DEPARTMENT_OTHER)
Admission: EM | Admit: 2018-08-25 | Discharge: 2018-08-25 | Disposition: A | Discharge status: Home / Self Care | Payer: Medicaid Other | Attending: Emergency Medicine | Admitting: Emergency Medicine

## 2018-08-25 DIAGNOSIS — Z79899 Other long term (current) drug therapy: Secondary | ICD-10-CM | POA: Insufficient documentation

## 2018-08-25 DIAGNOSIS — Z87891 Personal history of nicotine dependence: Secondary | ICD-10-CM | POA: Insufficient documentation

## 2018-08-25 DIAGNOSIS — J4520 Mild intermittent asthma, uncomplicated: Secondary | ICD-10-CM | POA: Insufficient documentation

## 2018-08-25 DIAGNOSIS — R0982 Postnasal drip: Secondary | ICD-10-CM | POA: Insufficient documentation

## 2018-08-25 DIAGNOSIS — R0981 Nasal congestion: Secondary | ICD-10-CM | POA: Insufficient documentation

## 2018-08-25 DIAGNOSIS — J45909 Unspecified asthma, uncomplicated: Secondary | ICD-10-CM | POA: Insufficient documentation

## 2018-08-25 MED ORDER — ALBUTEROL SULFATE HFA 108 (90 BASE) MCG/ACT IN AERS
2.0000 | INHALATION_SPRAY | Freq: Once | RESPIRATORY_TRACT | Status: AC
  Administered 2018-08-25: 2 via RESPIRATORY_TRACT
  Filled 2018-08-25: qty 6.7

## 2018-08-25 NOTE — Discharge Instructions (Addendum)
You may take over-the-counter medicine for symptomatic relief, such as Tylenol, Motrin, TheraFlu, Alka seltzer , black elderberry, etc. Please limit acetaminophen (Tylenol) to 4000 mg and Ibuprofen (Motrin, Advil, etc.) to 2400 mg for a 24hr period. Please note that other over-the-counter medicine may contain acetaminophen or ibuprofen as a component of their ingredients.   

## 2018-08-25 NOTE — ED Notes (Signed)
Pt understood dc material. NAD noted. Follow up information discussed. All questions answered to satisfaction.

## 2018-08-25 NOTE — ED Triage Notes (Signed)
Pt states that for the past week he has been suffering from a cough for the past week along with headaches. Denies fever, nausea, or vomiting.

## 2018-08-25 NOTE — ED Provider Notes (Signed)
[facility] EMERGENCY DEPARTMENT Provider Note  CSN: [id number] Arrival date & time: 08/25/18 [id number]  Chief Complaint(s) Cough  HPI Darren Finley is a 23 y.o. male with a history of asthma who presents to the emergency department with several days of nasal congestion and cough.  Here for mild shortness of breath after smoking hookah.  Does not have his inhaler.  Denies any fevers or chills.  No chest pain.  No abdominal pain.  No nausea vomiting.  HPI  Past Medical History Past Medical History:  Diagnosis Date  . Asthma    There are no active problems to display for this patient.  Home Medication(s) Prior to Admission medications   Medication Sig Start Date End Date Taking? Authorizing Provider  acetaminophen (TYLENOL) 500 MG tablet Take 1 tablet (500 mg total) by mouth every 6 (six) hours as needed. 05/24/18   Law, Waylan BogaAlexandra M, PA-C  bacitracin ointment Apply 1 application topically daily. To all wounds. 05/24/18   Law, Waylan BogaAlexandra M, PA-C  benzonatate (TESSALON) 100 MG capsule Take 1 capsule (100 mg total) by mouth every 8 (eight) hours. Patient not taking: Reported on 05/24/2018 04/27/18   Emi HolesLaw, Alexandra M, PA-C  ciprofloxacin (CIPRO) 500 MG tablet Take 1 tablet (500 mg total) by mouth every 12 (twelve) hours. Patient not taking: Reported on 05/24/2018 04/27/18   Emi HolesLaw, Alexandra M, PA-C  fluticasone (FLONASE) 50 MCG/ACT nasal spray Place 2 sprays into both nostrils daily. Patient not taking: Reported on 05/24/2018 04/27/18   Emi HolesLaw, Alexandra M, PA-C  ibuprofen (ADVIL,MOTRIN) 600 MG tablet Take 1 tablet (600 mg total) by mouth every 6 (six) hours as needed. 05/24/18   Law, Waylan BogaAlexandra M, PA-C  methocarbamol (ROBAXIN) 750 MG tablet Take 1 tablet (750 mg total) by mouth 2 (two) times daily. 05/24/18   Emi HolesLaw, Alexandra M, PA-C                                                                                                                                    Past Surgical History History  reviewed. No pertinent surgical history. Family History No family history on file.  Social History Social History   Tobacco Use  . Smoking status: Former Smoker    Types: Cigarettes  . Smokeless tobacco: Never Used  Substance Use Topics  . Alcohol use: Yes    Comment: occ  . Drug use: Not Currently    Types: Marijuana   Allergies Patient has no known allergies.  Review of Systems Review of Systems All other systems are reviewed and are negative for acute change except as noted in the HPI  Physical Exam Vital Signs  I have reviewed the triage vital signs BP 139/81   Pulse 66   Temp (!) 97.2 F (36.2 C) (Oral)   Resp 18   Ht 6\' 6"  (1.981 m)   Wt 108.9 kg   SpO2 94%   BMI 27.73 kg/m  Physical Exam Vitals signs reviewed.  Constitutional:      General: He is not in acute distress.    Appearance: He is well-developed. He is not diaphoretic.  HENT:     Head: Normocephalic and atraumatic.     Right Ear: Tympanic membrane normal.     Left Ear: Tympanic membrane normal.     Nose: Mucosal edema and rhinorrhea present.     Mouth/Throat:     Mouth: Mucous membranes are moist.     Tongue: No lesions.     Palate: No lesions.     Pharynx: Posterior oropharyngeal erythema (mild with PND and cobblestoning) present. No pharyngeal swelling, oropharyngeal exudate or uvula swelling.     Tonsils: No tonsillar exudate.  Eyes:     General: No scleral icterus.       Right eye: No discharge.        Left eye: No discharge.     Conjunctiva/sclera: Conjunctivae normal.     Pupils: Pupils are equal, round, and reactive to light.  Neck:     Musculoskeletal: Normal range of motion and neck supple.  Cardiovascular:     Rate and Rhythm: Normal rate and regular rhythm.     Heart sounds: No murmur. No friction rub. No gallop.   Pulmonary:     Effort: Pulmonary effort is normal. No respiratory distress.     Breath sounds: Normal breath sounds. No stridor. No rales.  Abdominal:      General: There is no distension.     Palpations: Abdomen is soft.     Tenderness: There is no abdominal tenderness.  Musculoskeletal:        General: No tenderness.  Skin:    General: Skin is warm and dry.     Findings: No erythema or rash.  Neurological:     Mental Status: He is alert and oriented to person, place, and time.     ED Results and Treatments Labs (all labs ordered are listed, but only abnormal results are displayed) Labs Reviewed - No data to display                                                                                                                       EKG  EKG Interpretation  Date/Time:    Ventricular Rate:    PR Interval:    QRS Duration:   QT Interval:    QTC Calculation:   R Axis:     Text Interpretation:        Radiology No results found. Pertinent labs & imaging results that were available during my care of the patient were reviewed by me and considered in my medical decision making (see chart for details).  Medications Ordered in ED Medications  albuterol (PROVENTIL HFA;VENTOLIN HFA) 108 (90 Base) MCG/ACT inhaler 2 puff (2 puffs Inhalation Given 08/25/18 0324)  Procedures Procedures  (including critical care time)  Medical Decision Making / ED Course I have reviewed the nursing notes for this encounter and the patient's prior records (if available in EHR or on provided paperwork).    Patient presents with several days of nasal congestion, rhinorrhea and cough.  Also here for shortness of breath after smoking hookah.  Prior to my evaluation, patient was provided with 4 puffs of albuterol which resolved his shortness of breath.  Patient is afebrile with stable vital signs, well-appearing, nontoxic.  Symptoms is likely allergic versus viral in nature.  Asthma was exacerbated by smoking.  Now improved after  treatment.  No indication for chest x-ray at this time.  The patient appears reasonably screened and/or stabilized for discharge and I doubt any other medical condition or other [facility] requiring further screening, evaluation, or treatment in the ED at this time prior to discharge. The patient is safe for discharge with strict return precautions.   Final Clinical Impression(s) / ED Diagnoses Final diagnoses:  Nasal congestion  Post-nasal drainage  Mild intermittent asthma without complication   Disposition: Discharge  Condition: Good  I have discussed the results, Dx and Tx plan with the patient who expressed understanding and agree(s) with the plan. Discharge instructions discussed at great length. The patient was given strict return precautions who verbalized understanding of the instructions. No further questions at time of discharge.    ED Discharge Orders    None       Follow Up: Primary care provider  Schedule an appointment as soon as possible for a visit  If you do not have a primary care physician, contact [facility] at [phone] for referral      This chart was dictated using voice recognition software.  Despite best efforts to proofread,  errors can occur which can change the documentation meaning.   Nira Connardama, Breanna Shorkey Eduardo, MD 08/25/18 [phone]

## 2018-10-07 ENCOUNTER — Encounter (HOSPITAL_COMMUNITY): Payer: Self-pay | Admitting: Emergency Medicine

## 2018-10-07 ENCOUNTER — Other Ambulatory Visit: Payer: Self-pay

## 2018-10-07 ENCOUNTER — Emergency Department (HOSPITAL_COMMUNITY)
Admission: EM | Admit: 2018-10-07 | Discharge: 2018-10-07 | Disposition: A | Discharge status: Home / Self Care | Payer: Self-pay | Attending: Emergency Medicine | Admitting: Emergency Medicine

## 2018-10-07 ENCOUNTER — Emergency Department (HOSPITAL_COMMUNITY): Payer: Self-pay

## 2018-10-07 DIAGNOSIS — F17210 Nicotine dependence, cigarettes, uncomplicated: Secondary | ICD-10-CM | POA: Insufficient documentation

## 2018-10-07 DIAGNOSIS — J4541 Moderate persistent asthma with (acute) exacerbation: Secondary | ICD-10-CM | POA: Insufficient documentation

## 2018-10-07 DIAGNOSIS — Z79899 Other long term (current) drug therapy: Secondary | ICD-10-CM | POA: Insufficient documentation

## 2018-10-07 MED ORDER — ALBUTEROL SULFATE HFA 108 (90 BASE) MCG/ACT IN AERS: 2.0000 | INHALATION_SPRAY | RESPIRATORY_TRACT | 0 refills | Status: DC | PRN

## 2018-10-07 MED ORDER — ALBUTEROL SULFATE (2.5 MG/3ML) 0.083% IN NEBU
5.0000 mg | INHALATION_SOLUTION | Freq: Once | RESPIRATORY_TRACT | Status: AC
  Administered 2018-10-07: 5 mg via RESPIRATORY_TRACT
  Filled 2018-10-07: qty 6

## 2018-10-07 NOTE — ED Provider Notes (Signed)
[facility]-EMERGENCY DEPT Provider Note  CSN: [id number] Arrival date & time: 10/07/18 4098  Chief Complaint(s) Shortness of Breath  HPI Darren Finley is a 23 y.o. male   The history is provided by the patient.  Shortness of Breath  Severity:  Moderate Onset quality:  Gradual Duration:  3 days Timing:  Intermittent Progression:  Worsening Chronicity:  Recurrent Context: smoke exposure   Relieved by:  Nothing Associated symptoms: cough (dry) and wheezing   Associated symptoms: no fever and no sputum production   Risk factors: no tobacco use   Risk factors comment:  Smokes hookah   Past Medical History Past Medical History:  Diagnosis Date  . Asthma    There are no active problems to display for this patient.  Home Medication(s) Prior to Admission medications   Medication Sig Start Date End Date Taking? Authorizing Provider  Ascorbic Acid (VITAMIN C) 1000 MG tablet Take 1,000 mg by mouth daily.   Yes [provider]  Multiple Vitamin (MULTIVITAMIN WITH MINERALS) TABS tablet Take 1 tablet by mouth daily.   Yes [provider]  Omega-3 Fatty Acids (FISH OIL) 1000 MG CAPS Take 2,000 mg by mouth daily.   Yes [provider]  acetaminophen (TYLENOL) 500 MG tablet Take 1 tablet (500 mg total) by mouth every 6 (six) hours as needed. Patient not taking: Reported on 10/07/2018 05/24/18   Emi Holes, PA-C  albuterol (PROVENTIL HFA;VENTOLIN HFA) 108 (90 Base) MCG/ACT inhaler Inhale 2-4 puffs into the lungs every 4 (four) hours as needed for wheezing or shortness of breath. 10/07/18   CardamaAmadeo Garnet, MD  bacitracin ointment Apply 1 application topically daily. To all wounds. Patient not taking: Reported on 10/07/2018 05/24/18   Emi Holes, PA-C  benzonatate (TESSALON) 100 MG capsule Take 1 capsule (100 mg total) by mouth every 8 (eight) hours. Patient not taking: Reported on 05/24/2018 04/27/18   Emi Holes, PA-C   ciprofloxacin (CIPRO) 500 MG tablet Take 1 tablet (500 mg total) by mouth every 12 (twelve) hours. Patient not taking: Reported on 05/24/2018 04/27/18   Emi Holes, PA-C  fluticasone (FLONASE) 50 MCG/ACT nasal spray Place 2 sprays into both nostrils daily. Patient not taking: Reported on 05/24/2018 04/27/18   Emi Holes, PA-C  ibuprofen (ADVIL,MOTRIN) 600 MG tablet Take 1 tablet (600 mg total) by mouth every 6 (six) hours as needed. Patient not taking: Reported on 10/07/2018 05/24/18   Emi Holes, PA-C  methocarbamol (ROBAXIN) 750 MG tablet Take 1 tablet (750 mg total) by mouth 2 (two) times daily. Patient not taking: Reported on 10/07/2018 05/24/18   Emi Holes, PA-C                                                                                                                                    Past Surgical History History reviewed. No pertinent surgical history. Family History History  reviewed. No pertinent family history.  Social History Social History   Tobacco Use  . Smoking status: Current Some Day Smoker    Types: Cigarettes  . Smokeless tobacco: Never Used  Substance Use Topics  . Alcohol use: Yes    Comment: occ  . Drug use: Not Currently    Types: Marijuana   Allergies Patient has no known allergies.  Review of Systems Review of Systems  Constitutional: Negative for fever.  Respiratory: Positive for cough (dry), shortness of breath and wheezing. Negative for sputum production.    All other systems are reviewed and are negative for acute change except as noted in the HPI  Physical Exam Vital Signs  I have reviewed the triage vital signs BP 125/74 (BP Location: Right Arm)   Pulse 68   Temp 97.7 F (36.5 C) (Oral)   Resp 15   Ht 6' (1.829 m)   Wt 111.1 kg   SpO2 96%   BMI 33.23 kg/m   Physical Exam Vitals signs reviewed.  Constitutional:      General: He is not in acute distress.    Appearance: He is well-developed. He is not  diaphoretic.  HENT:     Head: Normocephalic and atraumatic.     Nose: Nose normal.  Eyes:     General: No scleral icterus.       Right eye: No discharge.        Left eye: No discharge.     Conjunctiva/sclera: Conjunctivae normal.     Pupils: Pupils are equal, round, and reactive to light.  Neck:     Musculoskeletal: Normal range of motion and neck supple.  Cardiovascular:     Rate and Rhythm: Normal rate and regular rhythm.     Heart sounds: No murmur. No friction rub. No gallop.   Pulmonary:     Effort: Pulmonary effort is normal. No respiratory distress.     Breath sounds: No stridor. Examination of the right-middle field reveals wheezing. Examination of the left-middle field reveals wheezing. Examination of the right-lower field reveals wheezing. Examination of the left-lower field reveals wheezing. Wheezing present. No rales.  Abdominal:     General: There is no distension.     Palpations: Abdomen is soft.     Tenderness: There is no abdominal tenderness.  Musculoskeletal:        General: No tenderness.  Skin:    General: Skin is warm and dry.     Findings: No erythema or rash.  Neurological:     Mental Status: He is alert and oriented to person, place, and time.     ED Results and Treatments Labs (all labs ordered are listed, but only abnormal results are displayed) Labs Reviewed - No data to display                                                                                                                       EKG  EKG Interpretation  Date/Time:  Monday October 07 2018  04:34:20 EDT Ventricular Rate:  63 PR Interval:    QRS Duration: 105 QT Interval:  384 QTC Calculation: 393 R Axis:   76 Text Interpretation:  Sinus rhythm ST elev, probable normal early repol pattern Baseline wander in lead(s) V3 V5 V6 No significant change since last tracing Confirmed by Drema Pryardama,  [phone]([id number]) on 10/07/2018 4:43:56 AM      Radiology Dg Chest 2 View  Result Date: 10/07/2018  CLINICAL DATA:  Shortness of breath EXAM: CHEST - 2 VIEW COMPARISON:  06/27/2018 FINDINGS: Artifact from EKG leads. Airway thickening with tram track appearance on the lateral view. There is no edema, consolidation, effusion, or pneumothorax. Normal heart size and mediastinal contours IMPRESSION: Airway thickening. Electronically Signed   By: Marnee SpringJonathon  Watts M.D.   On: 10/07/2018 05:11   Pertinent labs & imaging results that were available during my care of the patient were reviewed by me and considered in my medical decision making (see chart for details).  Medications Ordered in ED Medications  albuterol (PROVENTIL) (2.5 MG/3ML) 0.083% nebulizer solution 5 mg (5 mg Nebulization Given 10/07/18 0457)                                                                                                                                    Procedures Procedures  (including critical care time)  Medical Decision Making / ED Course I have reviewed the nursing notes for this encounter and the patient's prior records (if available in EHR or on provided paperwork).    Asthma exacerbation, moderate. Wheezing in lower fields. No significant increased WOB. No hypoxia. CXR from triage w/o PNA, PTx.  Wheezing and SOB resolved following breathing treatment.  The patient appears reasonably screened and/or stabilized for discharge and I doubt any other medical condition or other [facility] requiring further screening, evaluation, or treatment in the ED at this time prior to discharge.  The patient is safe for discharge with strict return precautions.   Final Clinical Impression(s) / ED Diagnoses Final diagnoses:  Moderate persistent asthma with exacerbation   Disposition: Discharge  Condition: Good  I have discussed the results, Dx and Tx plan with the patient who expressed understanding and agree(s) with the plan. Discharge instructions discussed at great length. The patient was given strict return precautions  who verbalized understanding of the instructions. No further questions at time of discharge.    ED Discharge Orders         Ordered    albuterol (PROVENTIL HFA;VENTOLIN HFA) 108 (90 Base) MCG/ACT inhaler  Every 4 hours PRN     10/07/18 0607           Follow Up: Primary care provider  Schedule an appointment as soon as possible for a visit  If you do not have a primary care physician, contact [facility] at [phone] for referral      This chart was dictated using voice recognition software.  Despite best efforts to  proofread,  errors can occur which can change the documentation meaning.   Nira Conn, MD 10/07/18 [phone]

## 2018-10-07 NOTE — ED Triage Notes (Signed)
Patient states he was having really bad shortness of breath about 30 min ago. Patient states he has been having this for the past 3 days. Patient is talking without any difficulty or extra effort. Patient O2 SAT- 96% on room air.

## 2019-01-29 ENCOUNTER — Encounter (HOSPITAL_BASED_OUTPATIENT_CLINIC_OR_DEPARTMENT_OTHER): Payer: Self-pay | Admitting: Emergency Medicine

## 2019-01-29 ENCOUNTER — Other Ambulatory Visit: Payer: Self-pay

## 2019-01-29 ENCOUNTER — Emergency Department (HOSPITAL_BASED_OUTPATIENT_CLINIC_OR_DEPARTMENT_OTHER)
Admission: EM | Admit: 2019-01-29 | Discharge: 2019-01-29 | Disposition: A | Discharge status: Home / Self Care | Payer: HRSA Program | Attending: Emergency Medicine | Admitting: Emergency Medicine

## 2019-01-29 DIAGNOSIS — J45909 Unspecified asthma, uncomplicated: Secondary | ICD-10-CM | POA: Insufficient documentation

## 2019-01-29 DIAGNOSIS — Z20822 Contact with and (suspected) exposure to covid-19: Secondary | ICD-10-CM

## 2019-01-29 DIAGNOSIS — R0981 Nasal congestion: Secondary | ICD-10-CM | POA: Diagnosis present

## 2019-01-29 DIAGNOSIS — Z79899 Other long term (current) drug therapy: Secondary | ICD-10-CM | POA: Diagnosis not present

## 2019-01-29 DIAGNOSIS — U071 COVID-19: Secondary | ICD-10-CM | POA: Insufficient documentation

## 2019-01-29 DIAGNOSIS — F17210 Nicotine dependence, cigarettes, uncomplicated: Secondary | ICD-10-CM | POA: Diagnosis not present

## 2019-01-29 MED ORDER — IBUPROFEN 800 MG PO TABS
800.0000 mg | ORAL_TABLET | Freq: Once | ORAL | Status: AC
  Administered 2019-01-29: 800 mg via ORAL
  Filled 2019-01-29: qty 1

## 2019-01-29 MED ORDER — OXYMETAZOLINE HCL 0.05 % NA SOLN
1.0000 | Freq: Once | NASAL | Status: AC
  Administered 2019-01-29: 1 via NASAL
  Filled 2019-01-29: qty 30

## 2019-01-29 NOTE — ED Provider Notes (Signed)
[facility] EMERGENCY DEPARTMENT Provider Note   CSN: [id number] Arrival date & time: 01/29/19  1155    History   Chief Complaint Chief Complaint  Patient presents with  . Headache  . Nasal Congestion    HPI Darren Finley is a 23 y.o. male.     Pt presents to the ED today with lack of smell and possible sinus infection.  Pt said he's been to bars and with his friends without a mask.  Pt denies any sob or fever.  No cough.  No known covid exposures.     Past Medical History:  Diagnosis Date  . Asthma     There are no active problems to display for this patient.   History reviewed. No pertinent surgical history.      Home Medications    Prior to Admission medications   Medication Sig Start Date End Date Taking? Authorizing Provider  acetaminophen (TYLENOL) 500 MG tablet Take 1 tablet (500 mg total) by mouth every 6 (six) hours as needed. Patient not taking: Reported on 10/07/2018 05/24/18   Emi HolesLaw, Alexandra M, PA-C  albuterol (PROVENTIL HFA;VENTOLIN HFA) 108 (90 Base) MCG/ACT inhaler Inhale 2-4 puffs into the lungs every 4 (four) hours as needed for wheezing or shortness of breath. 10/07/18   CardamaAmadeo Garnet, Pedro Eduardo, MD  Ascorbic Acid (VITAMIN C) 1000 MG tablet Take 1,000 mg by mouth daily.    [provider]  bacitracin ointment Apply 1 application topically daily. To all wounds. Patient not taking: Reported on 10/07/2018 05/24/18   Emi HolesLaw, Alexandra M, PA-C  benzonatate (TESSALON) 100 MG capsule Take 1 capsule (100 mg total) by mouth every 8 (eight) hours. Patient not taking: Reported on 05/24/2018 04/27/18   Emi HolesLaw, Alexandra M, PA-C  ciprofloxacin (CIPRO) 500 MG tablet Take 1 tablet (500 mg total) by mouth every 12 (twelve) hours. Patient not taking: Reported on 05/24/2018 04/27/18   Emi HolesLaw, Alexandra M, PA-C  fluticasone (FLONASE) 50 MCG/ACT nasal spray Place 2 sprays into both nostrils daily. Patient not taking: Reported on 05/24/2018 04/27/18   Emi HolesLaw, Alexandra  M, PA-C  ibuprofen (ADVIL,MOTRIN) 600 MG tablet Take 1 tablet (600 mg total) by mouth every 6 (six) hours as needed. Patient not taking: Reported on 10/07/2018 05/24/18   Emi HolesLaw, Alexandra M, PA-C  methocarbamol (ROBAXIN) 750 MG tablet Take 1 tablet (750 mg total) by mouth 2 (two) times daily. Patient not taking: Reported on 10/07/2018 05/24/18   Emi HolesLaw, Alexandra M, PA-C  Multiple Vitamin (MULTIVITAMIN WITH MINERALS) TABS tablet Take 1 tablet by mouth daily.    [provider]  Omega-3 Fatty Acids (FISH OIL) 1000 MG CAPS Take 2,000 mg by mouth daily.    [provider]    Family History No family history on file.  Social History Social History   Tobacco Use  . Smoking status: Current Some Day Smoker    Types: Cigarettes  . Smokeless tobacco: Never Used  Substance Use Topics  . Alcohol use: Yes    Comment: occ  . Drug use: Not Currently    Types: Marijuana     Allergies   Patient has no known allergies.   Review of Systems Review of Systems  HENT:       Anosmia  Neurological: Positive for headaches.  All other systems reviewed and are negative.    Physical Exam Updated Vital Signs BP (!) 118/54 (BP Location: Right Arm)   Pulse 81   Temp 98.2 F (36.8 C) (Oral)   Resp 14  Ht 6\' 6"  (1.981 m)   Wt 108.9 kg   SpO2 98%   BMI 27.73 kg/m   Physical Exam Vitals signs and nursing note reviewed.  Constitutional:      Appearance: He is well-developed.  HENT:     Head: Normocephalic and atraumatic.     Mouth/Throat:     Mouth: Mucous membranes are moist.     Pharynx: Oropharynx is clear.  Eyes:     Extraocular Movements: Extraocular movements intact.     Pupils: Pupils are equal, round, and reactive to light.  Neck:     Musculoskeletal: Normal range of motion and neck supple.  Cardiovascular:     Rate and Rhythm: Normal rate and regular rhythm.  Pulmonary:     Effort: Pulmonary effort is normal.     Breath sounds: Normal breath sounds.  Abdominal:      General: Bowel sounds are normal.     Palpations: Abdomen is soft.  Musculoskeletal: Normal range of motion.  Skin:    General: Skin is warm and dry.     Capillary Refill: Capillary refill takes less than 2 seconds.  Neurological:     Mental Status: He is alert and oriented to person, place, and time.  Psychiatric:        Mood and Affect: Mood normal.        Speech: Speech normal.        Behavior: Behavior normal.      ED Treatments / Results  Labs (all labs ordered are listed, but only abnormal results are displayed) Labs Reviewed  NOVEL CORONAVIRUS, NAA (HOSPITAL ORDER, SEND-OUT TO REF LAB)    EKG None  Radiology No results found.  Procedures Procedures (including critical care time)  Medications Ordered in ED Medications  ibuprofen (ADVIL) tablet 800 mg (has no administration in time range)  oxymetazoline (AFRIN) 0.05 % nasal spray 1 spray (has no administration in time range)     Initial Impression / Assessment and Plan / ED Course  I have reviewed the triage vital signs and the nursing notes.  Pertinent labs & imaging results that were available during my care of the patient were reviewed by me and considered in my medical decision making (see chart for details).     Sinuses are non-tender.  He otherwise looks well.  He is not hypoxic or sob, so no need for CXR.  I suspect the pt may have covid.  He is told to self-isolate until test is back.  Return if worse.  Darren Finley was evaluated in Emergency Department on 01/29/2019 for the symptoms described in the history of present illness. He was evaluated in the context of the global COVID-19 pandemic, which necessitated consideration that the patient might be at risk for infection with the SARS-CoV-2 virus that causes COVID-19. Institutional protocols and algorithms that pertain to the evaluation of patients at risk for COVID-19 are in a state of rapid change based on information released by regulatory bodies  including the CDC and federal and state organizations. These policies and algorithms were followed during the patient's care in the ED.  Final Clinical Impressions(s) / ED Diagnoses   Final diagnoses:  Suspected Covid-19 Virus Infection    ED Discharge Orders    None       Isla Pence, MD 01/29/19 1237

## 2019-01-29 NOTE — ED Triage Notes (Signed)
Pt reports started sneezing a few days ago; HAs x 3d; loss of taste and smell since yesterday

## 2019-01-31 LAB — NOVEL CORONAVIRUS, NAA (HOSP ORDER, SEND-OUT TO REF LAB; TAT 18-24 HRS): SARS-CoV-2, NAA: DETECTED — AB

## 2019-04-17 ENCOUNTER — Other Ambulatory Visit: Payer: Self-pay

## 2019-04-17 ENCOUNTER — Emergency Department (HOSPITAL_BASED_OUTPATIENT_CLINIC_OR_DEPARTMENT_OTHER)
Admission: EM | Admit: 2019-04-17 | Discharge: 2019-04-17 | Disposition: A | Discharge status: Home / Self Care | Payer: Medicaid Other | Attending: Emergency Medicine | Admitting: Emergency Medicine

## 2019-04-17 ENCOUNTER — Emergency Department (HOSPITAL_BASED_OUTPATIENT_CLINIC_OR_DEPARTMENT_OTHER): Payer: Medicaid Other

## 2019-04-17 ENCOUNTER — Encounter (HOSPITAL_BASED_OUTPATIENT_CLINIC_OR_DEPARTMENT_OTHER): Payer: Self-pay | Admitting: Emergency Medicine

## 2019-04-17 DIAGNOSIS — F17210 Nicotine dependence, cigarettes, uncomplicated: Secondary | ICD-10-CM | POA: Insufficient documentation

## 2019-04-17 DIAGNOSIS — J4521 Mild intermittent asthma with (acute) exacerbation: Secondary | ICD-10-CM | POA: Insufficient documentation

## 2019-04-17 DIAGNOSIS — Z79899 Other long term (current) drug therapy: Secondary | ICD-10-CM | POA: Insufficient documentation

## 2019-04-17 MED ORDER — LORATADINE 10 MG PO TABS: 10.0000 mg | ORAL_TABLET | Freq: Every day | ORAL | 0 refills | Status: DC

## 2019-04-17 MED ORDER — IPRATROPIUM BROMIDE HFA 17 MCG/ACT IN AERS
4.0000 | INHALATION_SPRAY | Freq: Once | RESPIRATORY_TRACT | Status: AC
  Administered 2019-04-17: 04:00:00 4 via RESPIRATORY_TRACT
  Filled 2019-04-17: qty 12.9

## 2019-04-17 MED ORDER — LORATADINE 10 MG PO TABS
10.0000 mg | ORAL_TABLET | Freq: Once | ORAL | Status: AC
  Administered 2019-04-17: 04:00:00 10 mg via ORAL
  Filled 2019-04-17: qty 1

## 2019-04-17 MED ORDER — DEXAMETHASONE SODIUM PHOSPHATE 10 MG/ML IJ SOLN
10.0000 mg | Freq: Once | INTRAMUSCULAR | Status: AC
  Administered 2019-04-17: 10 mg via INTRAMUSCULAR
  Filled 2019-04-17: qty 1

## 2019-04-17 MED ORDER — PREDNISONE 20 MG PO TABS: ORAL_TABLET | ORAL | 0 refills | Status: DC

## 2019-04-17 MED ORDER — FAMOTIDINE 20 MG PO TABS
20.0000 mg | ORAL_TABLET | Freq: Once | ORAL | Status: AC
  Administered 2019-04-17: 04:00:00 20 mg via ORAL
  Filled 2019-04-17: qty 1

## 2019-04-17 MED ORDER — ALBUTEROL SULFATE HFA 108 (90 BASE) MCG/ACT IN AERS
8.0000 | INHALATION_SPRAY | Freq: Once | RESPIRATORY_TRACT | Status: AC
  Administered 2019-04-17: 8 via RESPIRATORY_TRACT
  Filled 2019-04-17: qty 6.7

## 2019-04-17 NOTE — ED Triage Notes (Signed)
Patient presents with complaints of shortness of breath and wheezing onset 2-3 days ago; states using inhaler at home with no relief. Denies fever, denies loss of taste or loss of smell.

## 2019-04-17 NOTE — ED Provider Notes (Signed)
[facility] EMERGENCY DEPARTMENT Provider Note   CSN: [id number] Arrival date & time: 04/17/19  0315     History   Chief Complaint Chief Complaint  Patient presents with  . Shortness of Breath    HPI Darren Finley is a 23 y.o. male.     The history is provided by the patient.  Wheezing Severity:  Moderate Severity compared to prior episodes:  Similar Onset quality:  Gradual Timing:  Constant Progression:  Waxing and waning Chronicity:  Recurrent Context: not dust, not medical treatments, not smoke exposure and not strong odors   Relieved by:  Nothing Worsened by:  Nothing Ineffective treatments:  Beta-agonist inhaler Associated symptoms: no chest pain, no cough, no ear pain, no fever, no headaches, no orthopnea, no PND, no rhinorrhea, no sore throat, no sputum production, no stridor and no swollen glands   Risk factors: no prior ICU admissions     Past Medical History:  Diagnosis Date  . Asthma     There are no active problems to display for this patient.   History reviewed. No pertinent surgical history.      Home Medications    Prior to Admission medications   Medication Sig Start Date End Date Taking? Authorizing Provider  acetaminophen (TYLENOL) 500 MG tablet Take 1 tablet (500 mg total) by mouth every 6 (six) hours as needed. Patient not taking: Reported on 10/07/2018 05/24/18   Emi Holes, PA-C  albuterol (PROVENTIL HFA;VENTOLIN HFA) 108 (90 Base) MCG/ACT inhaler Inhale 2-4 puffs into the lungs every 4 (four) hours as needed for wheezing or shortness of breath. 10/07/18   CardamaAmadeo Garnet, MD  Ascorbic Acid (VITAMIN C) 1000 MG tablet Take 1,000 mg by mouth daily.    [provider]  bacitracin ointment Apply 1 application topically daily. To all wounds. Patient not taking: Reported on 10/07/2018 05/24/18   Emi Holes, PA-C  benzonatate (TESSALON) 100 MG capsule Take 1 capsule (100 mg total) by mouth every 8 (eight)  hours. Patient not taking: Reported on 05/24/2018 04/27/18   Emi Holes, PA-C  ciprofloxacin (CIPRO) 500 MG tablet Take 1 tablet (500 mg total) by mouth every 12 (twelve) hours. Patient not taking: Reported on 05/24/2018 04/27/18   Emi Holes, PA-C  fluticasone (FLONASE) 50 MCG/ACT nasal spray Place 2 sprays into both nostrils daily. Patient not taking: Reported on 05/24/2018 04/27/18   Emi Holes, PA-C  ibuprofen (ADVIL,MOTRIN) 600 MG tablet Take 1 tablet (600 mg total) by mouth every 6 (six) hours as needed. Patient not taking: Reported on 10/07/2018 05/24/18   Emi Holes, PA-C  methocarbamol (ROBAXIN) 750 MG tablet Take 1 tablet (750 mg total) by mouth 2 (two) times daily. Patient not taking: Reported on 10/07/2018 05/24/18   Emi Holes, PA-C  Multiple Vitamin (MULTIVITAMIN WITH MINERALS) TABS tablet Take 1 tablet by mouth daily.    [provider]  Omega-3 Fatty Acids (FISH OIL) 1000 MG CAPS Take 2,000 mg by mouth daily.    [provider]    Family History No family history on file.  Social History Social History   Tobacco Use  . Smoking status: Current Some Day Smoker    Types: Cigarettes  . Smokeless tobacco: Never Used  Substance Use Topics  . Alcohol use: Yes    Comment: occ  . Drug use: Not Currently    Types: Marijuana     Allergies   Patient has no known allergies.   Review  of Systems Review of Systems  Constitutional: Negative for fever.  HENT: Negative for congestion, ear pain, rhinorrhea and sore throat.   Eyes: Negative for visual disturbance.  Respiratory: Positive for wheezing. Negative for cough, sputum production and stridor.   Cardiovascular: Negative for chest pain, palpitations, orthopnea, leg swelling and PND.  Gastrointestinal: Negative for abdominal pain.  Genitourinary: Negative for difficulty urinating.  Musculoskeletal: Negative for arthralgias.  Skin: Negative for wound.  Neurological: Negative for  dizziness and headaches.  Psychiatric/Behavioral: Negative for agitation.  All other systems reviewed and are negative.    Physical Exam Updated Vital Signs There were no vitals taken for this visit.  Physical Exam Vitals signs and nursing note reviewed.  Constitutional:      General: He is not in acute distress.    Appearance: He is normal weight.  HENT:     Head: Normocephalic and atraumatic.     Nose: Nose normal.  Eyes:     Conjunctiva/sclera: Conjunctivae normal.     Pupils: Pupils are equal, round, and reactive to light.  Neck:     Musculoskeletal: Normal range of motion and neck supple.  Cardiovascular:     Rate and Rhythm: Normal rate and regular rhythm.     Pulses: Normal pulses.     Heart sounds: Normal heart sounds.  Pulmonary:     Effort: No respiratory distress.     Breath sounds: Wheezing present. No rales.  Abdominal:     General: Abdomen is flat. Bowel sounds are normal.     Tenderness: There is no abdominal tenderness. There is no guarding or rebound.  Musculoskeletal: Normal range of motion.     Right lower leg: No edema.     Left lower leg: No edema.  Skin:    General: Skin is warm and dry.     Capillary Refill: Capillary refill takes less than 2 seconds.  Neurological:     General: No focal deficit present.     Mental Status: He is alert and oriented to person, place, and time.     Deep Tendon Reflexes: Reflexes normal.  Psychiatric:        Mood and Affect: Mood normal.        Behavior: Behavior normal.      ED Treatments / Results  Labs (all labs ordered are listed, but only abnormal results are displayed) Labs Reviewed - No data to display  EKG None  Radiology Results for orders placed or performed during the hospital encounter of 01/29/19  Novel Coronavirus,NAA,(SEND-OUT TO REF LAB - TAT 24-48 hrs); Hosp Order   Specimen: Nasopharyngeal Swab; Respiratory  Result Value Ref Range   SARS-CoV-2, NAA DETECTED (A) NOT DETECTED    Coronavirus Source NASOPHARYNGEAL    Dg Chest Portable 1 View  Result Date: 04/17/2019 CLINICAL DATA:  Wheezing EXAM: PORTABLE CHEST 1 VIEW COMPARISON:  10/07/2018 FINDINGS: Normal heart size and mediastinal contours. No acute infiltrate or edema. No effusion or pneumothorax. No acute osseous findings. IMPRESSION: Negative chest. Electronically Signed   By: Marnee SpringJonathon  Watts M.D.   On: 04/17/2019 04:15   Procedures Procedures (including critical care time)  Medications Ordered in ED Medications  dexamethasone (DECADRON) injection 10 mg (has no administration in time range)  famotidine (PEPCID) tablet 20 mg (has no administration in time range)  loratadine (CLARITIN) tablet 10 mg (has no administration in time range)  albuterol (VENTOLIN HFA) 108 (90 Base) MCG/ACT inhaler 8 puff (8 puffs Inhalation Given 04/17/19 0335)  ipratropium (ATROVENT HFA) inhaler  4 puff (4 puffs Inhalation Given 04/17/19 0335)     Symptoms improved post medication.  Will start steroids.  No signs of pneumonia.  Likely allergic related exacerbation.  Stable for discharge.    Darren Finley was evaluated in Emergency Department on 04/17/2019 for the symptoms described in the history of present illness. He was evaluated in the context of the global COVID-19 pandemic, which necessitated consideration that the patient might be at risk for infection with the SARS-CoV-2 virus that causes COVID-19. Institutional protocols and algorithms that pertain to the evaluation of patients at risk for COVID-19 are in a state of rapid change based on information released by regulatory bodies including the CDC and federal and state organizations. These policies and algorithms were followed during the patient's care in the ED.   Final Clinical Impressions(s) / ED Diagnoses  Return for intractable cough, coughing up blood,fevers >100.4 unrelieved by medication, shortness of breath, intractable vomiting, chest pain, shortness of breath,  weakness,numbness, changes in speech, facial asymmetry,abdominal pain, passing out,Inability to tolerate liquids or food, cough, altered mental status or any concerns. No signs of systemic illness or infection. The patient is nontoxic-appearing on exam and vital signs are within normal limits.   I have reviewed the triage vital signs and the nursing notes. Pertinent labs &imaging results that were available during my care of the patient were reviewed by me and considered in my medical decision making (see chart for details).After history, exam, and medical workup I feel the patient has beenappropriately medically screened and is safe for discharge home. Pertinent diagnoses were discussed with the patient. Patient was given return precautions.       Aizik Reh, MD 04/17/19 [phone]

## 2019-07-02 ENCOUNTER — Encounter (HOSPITAL_BASED_OUTPATIENT_CLINIC_OR_DEPARTMENT_OTHER): Payer: Self-pay | Admitting: Emergency Medicine

## 2019-07-02 ENCOUNTER — Emergency Department (HOSPITAL_BASED_OUTPATIENT_CLINIC_OR_DEPARTMENT_OTHER)
Admission: EM | Admit: 2019-07-02 | Discharge: 2019-07-02 | Disposition: A | Discharge status: Home / Self Care | Payer: Medicaid Other | Attending: Emergency Medicine | Admitting: Emergency Medicine

## 2019-07-02 ENCOUNTER — Other Ambulatory Visit: Payer: Self-pay

## 2019-07-02 DIAGNOSIS — F17210 Nicotine dependence, cigarettes, uncomplicated: Secondary | ICD-10-CM | POA: Insufficient documentation

## 2019-07-02 DIAGNOSIS — J9801 Acute bronchospasm: Secondary | ICD-10-CM | POA: Insufficient documentation

## 2019-07-02 DIAGNOSIS — Z79899 Other long term (current) drug therapy: Secondary | ICD-10-CM | POA: Insufficient documentation

## 2019-07-02 DIAGNOSIS — Z760 Encounter for issue of repeat prescription: Secondary | ICD-10-CM

## 2019-07-02 MED ORDER — IPRATROPIUM BROMIDE HFA 17 MCG/ACT IN AERS
2.0000 | INHALATION_SPRAY | Freq: Once | RESPIRATORY_TRACT | Status: AC
  Administered 2019-07-02: 2 via RESPIRATORY_TRACT
  Filled 2019-07-02: qty 12.9

## 2019-07-02 MED ORDER — ALBUTEROL SULFATE HFA 108 (90 BASE) MCG/ACT IN AERS
8.0000 | INHALATION_SPRAY | Freq: Once | RESPIRATORY_TRACT | Status: AC
  Administered 2019-07-02: 8 via RESPIRATORY_TRACT
  Filled 2019-07-02: qty 6.7

## 2019-07-02 MED ORDER — ALBUTEROL SULFATE (2.5 MG/3ML) 0.083% IN NEBU: INHALATION_SOLUTION | RESPIRATORY_TRACT | 0 refills | Status: DC

## 2019-07-02 MED ORDER — DEXAMETHASONE SODIUM PHOSPHATE 10 MG/ML IJ SOLN
10.0000 mg | Freq: Once | INTRAMUSCULAR | Status: AC
  Administered 2019-07-02: 10 mg via INTRAMUSCULAR
  Filled 2019-07-02: qty 1

## 2019-07-02 NOTE — ED Provider Notes (Signed)
[facility] DEPT [facility] Provider Note: Georgena Spurling, MD, FACEP  CSN: [id number] MRN: [id number] ARRIVAL: 07/02/19 at 0200 ROOM: MH04/MH04   CHIEF COMPLAINT  Asthma   HISTORY OF PRESENT ILLNESS  07/02/19 2:11 AM Zelma Mazariego is a 23 y.o. male with history of asthma.  He started feeling short of breath over the last several hours and has been out of his inhaler for 3 weeks.  He is having inspiratory and expiratory wheezes.  Oxygen saturation was noted to be 100%.  Symptoms are moderate and worse with exertion.  He denies fever, cough or pain.   Past Medical History:  Diagnosis Date  . Asthma     History reviewed. No pertinent surgical history.  No family history on file.  Social History   Tobacco Use  . Smoking status: Current Some Day Smoker    Types: Cigarettes  . Smokeless tobacco: Never Used  Substance Use Topics  . Alcohol use: Yes    Comment: occ  . Drug use: Not Currently    Types: Marijuana    Prior to Admission medications   Medication Sig Start Date End Date Taking? Authorizing Provider  albuterol (PROVENTIL HFA;VENTOLIN HFA) 108 (90 Base) MCG/ACT inhaler Inhale 2-4 puffs into the lungs every 4 (four) hours as needed for wheezing or shortness of breath. 10/07/18   Fatima Blank, MD  albuterol (PROVENTIL) (2.5 MG/3ML) 0.083% nebulizer solution Administer 2.5 mg (3 mL) via nebulizer every 6 hours as needed for wheezing or shortness of breath. 07/02/19   Khyle Goodell, MD  Ascorbic Acid (VITAMIN C) 1000 MG tablet Take 1,000 mg by mouth daily.    [provider]  loratadine (CLARITIN) 10 MG tablet Take 1 tablet (10 mg total) by mouth daily. 04/17/19   Palumbo, April, MD  montelukast (SINGULAIR) 10 MG tablet Take 10 mg by mouth daily. 04/03/19   [provider]  Multiple Vitamin (MULTIVITAMIN WITH MINERALS) TABS tablet Take 1 tablet by mouth daily.    [provider]  Omega-3 Fatty Acids (FISH OIL) 1000 MG CAPS Take 2,000 mg by  mouth daily.    [provider]  fluticasone (FLONASE) 50 MCG/ACT nasal spray Place 2 sprays into both nostrils daily. Patient not taking: Reported on 05/24/2018 04/27/18 07/02/19  Frederica Kuster, PA-C    Allergies Patient has no known allergies.   REVIEW OF SYSTEMS  Negative except as noted here or in the History of Present Illness.   PHYSICAL EXAMINATION  Initial Vital Signs Blood pressure 129/89, pulse 64, temperature 97.7 F (36.5 C), temperature source Oral, resp. rate (!) 24, height 6\' 6"  (1.981 m), weight 99.8 kg, SpO2 100 %.  Examination General: Well-developed, well-nourished male in no acute distress; appearance consistent with age of record HENT: normocephalic; atraumatic Eyes: pupils equal, round and reactive to light; extraocular muscles intact Neck: supple Heart: regular rate and rhythm Lungs: End inspiratory and end expiratory wheezes Abdomen: soft; nondistended; nontender; bowel sounds present Extremities: No deformity; full range of motion; pulses normal Neurologic: Awake, alert and oriented; motor function intact in all extremities and symmetric; no facial droop Skin: Warm and dry Psychiatric: Normal mood and affect   RESULTS  Summary of this visit's results, reviewed and interpreted by myself:   EKG Interpretation  Date/Time:    Ventricular Rate:    PR Interval:    QRS Duration:   QT Interval:    QTC Calculation:   R Axis:     Text Interpretation:  Laboratory Studies: No results found for this or any previous visit (from the past 24 hour(s)). Imaging Studies: No results found.  ED COURSE and MDM  Nursing notes, initial and subsequent vitals signs, including pulse oximetry, reviewed and interpreted by myself.  Vitals:   07/02/19 0206 07/02/19 0207  BP: 129/89   Pulse: 64   Resp: (!) 24   Temp: 97.7 F (36.5 C)   TempSrc: Oral   SpO2: 100%   Weight:  99.8 kg  Height:  6\' 6"  (1.981 m)   Medications  dexamethasone  (DECADRON) injection 10 mg (has no administration in time range)  albuterol (VENTOLIN HFA) 108 (90 Base) MCG/ACT inhaler 8 puff (8 puffs Inhalation Given 07/02/19 0221)  ipratropium (ATROVENT HFA) inhaler 2 puff (2 puffs Inhalation Given 07/02/19 0221)   2:28 AM Air movement improved, wheezing resolved after albuterol on Atrovent inhaler treatment.  Will give patient a dose of dexamethasone IM.  He is also requesting a home nebulizer.  PROCEDURES  Procedures   ED DIAGNOSES     ICD-10-CM   1. Acute bronchospasm  J98.01   2. Medication refill  Z76.0        Kierstin January, 14/9/20, MD 07/02/19 [phone]

## 2019-07-02 NOTE — ED Triage Notes (Signed)
Out of inhaler for asthma, feeling sob tonight. Out of Medication for 3 weeks. Able to speak. Insp/exp wheezes.

## 2019-07-22 ENCOUNTER — Other Ambulatory Visit: Payer: Self-pay

## 2019-07-22 ENCOUNTER — Encounter (HOSPITAL_COMMUNITY): Payer: Self-pay

## 2019-07-22 ENCOUNTER — Emergency Department (HOSPITAL_COMMUNITY)
Admission: EM | Admit: 2019-07-22 | Discharge: 2019-07-22 | Disposition: A | Discharge status: Home / Self Care | Payer: Medicaid Other | Attending: Emergency Medicine | Admitting: Emergency Medicine

## 2019-07-22 DIAGNOSIS — Z79899 Other long term (current) drug therapy: Secondary | ICD-10-CM | POA: Insufficient documentation

## 2019-07-22 DIAGNOSIS — R300 Dysuria: Secondary | ICD-10-CM | POA: Insufficient documentation

## 2019-07-22 DIAGNOSIS — J45909 Unspecified asthma, uncomplicated: Secondary | ICD-10-CM | POA: Insufficient documentation

## 2019-07-22 DIAGNOSIS — F17210 Nicotine dependence, cigarettes, uncomplicated: Secondary | ICD-10-CM | POA: Insufficient documentation

## 2019-07-22 DIAGNOSIS — Z760 Encounter for issue of repeat prescription: Secondary | ICD-10-CM | POA: Insufficient documentation

## 2019-07-22 LAB — URINALYSIS, ROUTINE W REFLEX MICROSCOPIC
Bilirubin Urine: NEGATIVE
Glucose, UA: NEGATIVE mg/dL
Hgb urine dipstick: NEGATIVE
Ketones, ur: NEGATIVE mg/dL
Leukocytes,Ua: NEGATIVE
Nitrite: NEGATIVE
Protein, ur: NEGATIVE mg/dL
Specific Gravity, Urine: 1.013 (ref 1.005–1.030)
pH: 8 (ref 5.0–8.0)

## 2019-07-22 MED ORDER — ALBUTEROL SULFATE (2.5 MG/3ML) 0.083% IN NEBU: INHALATION_SOLUTION | RESPIRATORY_TRACT | 0 refills | Status: DC

## 2019-07-22 NOTE — ED Triage Notes (Signed)
Pt states that he is out of his inhaler from 07/02/19. Pt states that he has hx of asthma. Pt states that he has had painful urination as well x 2 days. Denies any new sexual partners.

## 2019-07-22 NOTE — ED Provider Notes (Signed)
[name] Provider Note   CSN: [id number] Arrival date & time: 07/22/19  1037     History Chief Complaint  Patient presents with  . Asthma  . Dysuria    Darren Finley is a 23 y.o. male.  23 y.o male with a PMH of Asthma presents to the ED with a chief complaint of dysuria along with inhaler refill. Patient reports his dysuria began approximately 2 days ago, he also endorses urgency. He is currently sexually active but reports no concern for STI, no new partner encounters. He denies any penile discharge, testicular swelling, testicular pain.  Second complaint is inhaler refill, patient was seen here approximately 20 days ago, he reports he has run out of his inhaler.  He does report his asthma is usually worst in the mornings, relieved by inhaler use.  Of note, patient does not have a primary care physician that follows his asthma regimen.  He denies any chest pain, shortness of breath, or fevers.  Prior hospitalizations due to asthma exacerbation.  The history is provided by the patient.  Asthma This is a recurrent problem. Pertinent negatives include no chest pain, no abdominal pain and no shortness of breath.  Dysuria Presenting symptoms: dysuria   Associated symptoms: no abdominal pain and no fever        Past Medical History:  Diagnosis Date  . Asthma     There are no problems to display for this patient.   History reviewed. No pertinent surgical history.     History reviewed. No pertinent family history.  Social History   Tobacco Use  . Smoking status: Current Some Day Smoker    Types: Cigarettes  . Smokeless tobacco: Never Used  Substance Use Topics  . Alcohol use: Yes    Comment: occ  . Drug use: Not Currently    Types: Marijuana    Home Medications Prior to Admission medications   Medication Sig Start Date End Date Taking? Authorizing Provider  albuterol (PROVENTIL) (2.5 MG/3ML) 0.083% nebulizer solution  Administer 2.5 mg (3 mL) via nebulizer every 6 hours as needed for wheezing or shortness of breath. 07/22/19   Claude MangesSoto, Evelene Roussin, PA-C  Ascorbic Acid (VITAMIN C) 1000 MG tablet Take 1,000 mg by mouth daily.    [provider]  loratadine (CLARITIN) 10 MG tablet Take 1 tablet (10 mg total) by mouth daily. 04/17/19   Palumbo, April, MD  montelukast (SINGULAIR) 10 MG tablet Take 10 mg by mouth daily. 04/03/19   [provider]  Multiple Vitamin (MULTIVITAMIN WITH MINERALS) TABS tablet Take 1 tablet by mouth daily.    [provider]  Omega-3 Fatty Acids (FISH OIL) 1000 MG CAPS Take 2,000 mg by mouth daily.    [provider]  fluticasone (FLONASE) 50 MCG/ACT nasal spray Place 2 sprays into both nostrils daily. Patient not taking: Reported on 05/24/2018 04/27/18 07/02/19  Emi HolesLaw, Alexandra M, PA-C    Allergies    Patient has no known allergies.  Review of Systems   Review of Systems  Constitutional: Negative for fever.  Respiratory: Negative for shortness of breath and wheezing.   Cardiovascular: Negative for chest pain.  Gastrointestinal: Negative for abdominal pain.  Genitourinary: Positive for dysuria.    Physical Exam Updated Vital Signs BP 135/84 (BP Location: Left Arm)   Pulse (!) 57   Temp 98.2 F (36.8 C) (Oral)   Resp 18   Ht 6\' 6"  (1.981 m)   Wt 97.5 kg   SpO2 100%  BMI 24.85 kg/m   Physical Exam Vitals and nursing note reviewed.  Constitutional:      Appearance: He is well-developed.     Comments: Non ill appearing.   HENT:     Head: Normocephalic and atraumatic.  Eyes:     General: No scleral icterus.    Pupils: Pupils are equal, round, and reactive to light.  Cardiovascular:     Heart sounds: Normal heart sounds.  Pulmonary:     Effort: Pulmonary effort is normal.     Breath sounds: Normal breath sounds. No wheezing.     Comments: No wheezing, rhonchi, rales.  Chest:     Chest wall: No tenderness.  Abdominal:     General: Bowel  sounds are normal. There is no distension.     Palpations: Abdomen is soft.     Tenderness: There is no abdominal tenderness.  Genitourinary:    Comments: Patient deferred.  Musculoskeletal:        General: No tenderness or deformity.     Cervical back: Normal range of motion.  Skin:    General: Skin is warm and dry.  Neurological:     Mental Status: He is alert and oriented to person, place, and time.     ED Results / Procedures / Treatments   Labs (all labs ordered are listed, but only abnormal results are displayed) Labs Reviewed  URINALYSIS, ROUTINE W REFLEX MICROSCOPIC - Abnormal; Notable for the following components:      Result Value   Color, Urine STRAW (*)    All other components within normal limits  GC/CHLAMYDIA PROBE AMP ([place]) NOT AT [facility]    EKG None  Radiology No results found.  Procedures Procedures (including critical care time)  Medications Ordered in ED Medications - No data to display  ED Course  I have reviewed the triage vital signs and the nursing notes.  Pertinent labs & imaging results that were available during my care of the patient were reviewed by me and considered in my medical decision making (see chart for details).    MDM Rules/Calculators/A&P  Patient with a PMH of Asthma presents to the ED requesting inhaler refill, patient was seen here approximately 20 days ago, had his inhaler refilled.  Of second complaint, he does endorse dysuria, this has been happening for the past 2 days he is currently sexually active without any new partners.  He reports no concerns for sexually transmitted infections.  GU exam was deferred per patient's request.  During my evaluation lungs appear clear to auscultation without any wheezing, rhonchi, rales.  He denies any fevers, vitals are within normal limits he is afebrile satting at 100% on room air, speaking in full sentences.  Patient has been educated about appropriate use of the emergency  department, he has been referred to the [facility] health and wellness clinic in order to obtain primary care follow-up in order to have better asthma regimen follow-up by PCP, will refill inhaler at this time however advised to follow-up with primary care.  UA did not show any nitrites, leukocytes, white blood cell count.  Some suspicion that dysuria likely coming from a sexually transmitted infection.  I have offered patient treatment at this time, he has opted to wait for his gonorrhea and chlamydia swab which will be obtained via UA as he deferred his GU exam.  He understands that this may be inaccurate, agrees to follow-up with primary care.   Portions of this note were generated with Dragon dictation  software. Dictation errors may occur despite best attempts at proofreading.  Final Clinical Impression(s) / ED Diagnoses Final diagnoses:  Encounter for medication refill  Dysuria    Rx / DC Orders ED Discharge Orders         Ordered    albuterol (PROVENTIL) (2.5 MG/3ML) 0.083% nebulizer solution     07/22/19 1141           Claude Manges, PA-C 07/22/19 1211    Terald Sleeper, MD 07/22/19 2118

## 2019-07-22 NOTE — Discharge Instructions (Addendum)
We have refilled your inhaler today, you will need to schedule an appointment with your primary care physician in order to obtain better management of your asthma.  I have also sent your urine for gonorrhea and Chlamydia testing, this will not return for the next 48 hours.  If you experience any shortness of breath, chest pain, worsening symptoms you may return to the emergency department.

## 2019-07-23 LAB — GC/CHLAMYDIA PROBE AMP (~~LOC~~) NOT AT ARMC
Chlamydia: NEGATIVE
Neisseria Gonorrhea: NEGATIVE

## 2019-07-26 ENCOUNTER — Telehealth (HOSPITAL_COMMUNITY): Payer: Self-pay | Admitting: Emergency Medicine

## 2019-07-26 MED ORDER — ALBUTEROL SULFATE HFA 108 (90 BASE) MCG/ACT IN AERS: 2.0000 | INHALATION_SPRAY | RESPIRATORY_TRACT | 0 refills | Status: DC | PRN

## 2019-07-26 NOTE — Telephone Encounter (Signed)
Will refill albuterol inhaler.

## 2020-03-07 ENCOUNTER — Emergency Department (HOSPITAL_BASED_OUTPATIENT_CLINIC_OR_DEPARTMENT_OTHER)
Admission: EM | Admit: 2020-03-07 | Discharge: 2020-03-07 | Disposition: A | Discharge status: Home / Self Care | Payer: Self-pay | Attending: Emergency Medicine | Admitting: Emergency Medicine

## 2020-03-07 ENCOUNTER — Emergency Department (HOSPITAL_BASED_OUTPATIENT_CLINIC_OR_DEPARTMENT_OTHER): Payer: Self-pay

## 2020-03-07 ENCOUNTER — Encounter (HOSPITAL_BASED_OUTPATIENT_CLINIC_OR_DEPARTMENT_OTHER): Payer: Self-pay | Admitting: Emergency Medicine

## 2020-03-07 ENCOUNTER — Other Ambulatory Visit: Payer: Self-pay

## 2020-03-07 DIAGNOSIS — F17210 Nicotine dependence, cigarettes, uncomplicated: Secondary | ICD-10-CM | POA: Insufficient documentation

## 2020-03-07 DIAGNOSIS — S93401A Sprain of unspecified ligament of right ankle, initial encounter: Secondary | ICD-10-CM | POA: Insufficient documentation

## 2020-03-07 DIAGNOSIS — W19XXXA Unspecified fall, initial encounter: Secondary | ICD-10-CM | POA: Insufficient documentation

## 2020-03-07 DIAGNOSIS — Y9289 Other specified places as the place of occurrence of the external cause: Secondary | ICD-10-CM | POA: Insufficient documentation

## 2020-03-07 DIAGNOSIS — Y9367 Activity, basketball: Secondary | ICD-10-CM | POA: Insufficient documentation

## 2020-03-07 DIAGNOSIS — S92354A Nondisplaced fracture of fifth metatarsal bone, right foot, initial encounter for closed fracture: Secondary | ICD-10-CM | POA: Insufficient documentation

## 2020-03-07 DIAGNOSIS — Y999 Unspecified external cause status: Secondary | ICD-10-CM | POA: Insufficient documentation

## 2020-03-07 DIAGNOSIS — J45909 Unspecified asthma, uncomplicated: Secondary | ICD-10-CM | POA: Insufficient documentation

## 2020-03-07 DIAGNOSIS — Z7951 Long term (current) use of inhaled steroids: Secondary | ICD-10-CM | POA: Insufficient documentation

## 2020-03-07 MED ORDER — HYDROCODONE-ACETAMINOPHEN 5-325 MG PO TABS
1.0000 | ORAL_TABLET | Freq: Once | ORAL | Status: AC
  Administered 2020-03-07: 1 via ORAL
  Filled 2020-03-07: qty 1

## 2020-03-07 MED ORDER — HYDROCODONE-ACETAMINOPHEN 5-325 MG PO TABS: 1.0000 | ORAL_TABLET | Freq: Four times a day (QID) | ORAL | 0 refills | Status: DC | PRN

## 2020-03-07 NOTE — ED Provider Notes (Signed)
[facility] EMERGENCY DEPARTMENT Provider Note   CSN: [id number] Arrival date & time: 03/07/20  1243     History Chief Complaint  Patient presents with  . Ankle Injury    Darren Finley is a 24 y.o. male who presents for evaluation of right ankle and right foot pain that began yesterday after mechanical fall.  He reports that he was playing basketball and states that he jumped when he landed, he inverted his ankle.  Since then, he has had pain, swelling to the ankle.  He reports he has not been able to ambulate or put any weight.  He reports that when he did fall, he heard a pop.  He states that at home, he has tried ice and compression as well as Tylenol with minimal improvement in symptoms.  He denies any numbness/weakness.  The history is provided by the patient.       Past Medical History:  Diagnosis Date  . Asthma     There are no problems to display for this patient.   History reviewed. No pertinent surgical history.     History reviewed. No pertinent family history.  Social History   Tobacco Use  . Smoking status: Current Some Day Smoker    Types: Cigarettes  . Smokeless tobacco: Never Used  Vaping Use  . Vaping Use: Former  . Substances: Nicotine  Substance Use Topics  . Alcohol use: Yes    Comment: occ  . Drug use: Not Currently    Types: Marijuana    Home Medications Prior to Admission medications   Medication Sig Start Date End Date Taking? Authorizing Provider  albuterol (PROVENTIL) (2.5 MG/3ML) 0.083% nebulizer solution Administer 2.5 mg (3 mL) via nebulizer every 6 hours as needed for wheezing or shortness of breath. 07/22/19   Claude Manges, PA-C  albuterol (VENTOLIN HFA) 108 (90 Base) MCG/ACT inhaler Inhale 2 puffs into the lungs every 4 (four) hours as needed for wheezing or shortness of breath. 07/26/19   Tilden Fossa, MD  Ascorbic Acid (VITAMIN C) 1000 MG tablet Take 1,000 mg by mouth daily.    [provider]    HYDROcodone-acetaminophen (NORCO/VICODIN) 5-325 MG tablet Take 1-2 tablets by mouth every 6 (six) hours as needed. 03/07/20   Maxwell Caul, PA-C  loratadine (CLARITIN) 10 MG tablet Take 1 tablet (10 mg total) by mouth daily. 04/17/19   Palumbo, April, MD  montelukast (SINGULAIR) 10 MG tablet Take 10 mg by mouth daily. 04/03/19   [provider]  Multiple Vitamin (MULTIVITAMIN WITH MINERALS) TABS tablet Take 1 tablet by mouth daily.    [provider]  Omega-3 Fatty Acids (FISH OIL) 1000 MG CAPS Take 2,000 mg by mouth daily.    [provider]  fluticasone (FLONASE) 50 MCG/ACT nasal spray Place 2 sprays into both nostrils daily. Patient not taking: Reported on 05/24/2018 04/27/18 07/02/19  Emi Holes, PA-C    Allergies    Patient has no known allergies.  Review of Systems   Review of Systems  Musculoskeletal:       Right ankle and right foot pain  Neurological: Negative for weakness and numbness.  All other systems reviewed and are negative.   Physical Exam Updated Vital Signs BP 130/64   Pulse 85   Temp 98 F (36.7 C)   Resp 18   SpO2 100%   Physical Exam Vitals and nursing note reviewed.  Constitutional:      Appearance: He is well-developed.  HENT:  Head: Normocephalic and atraumatic.  Eyes:     General: No scleral icterus.       Right eye: No discharge.        Left eye: No discharge.     Conjunctiva/sclera: Conjunctivae normal.  Cardiovascular:     Pulses:          Dorsalis pedis pulses are 2+ on the right side and 2+ on the left side.  Pulmonary:     Effort: Pulmonary effort is normal.  Musculoskeletal:     Comments: Tenderness palpation noted to the lateral malleolus of the right ankle with overlying soft tissue swelling, ecchymosis.  The ecchymosis and tenderness extends towards the lateral aspect of the foot.  He has tenderness palpation noted to the mid fifth metatarsal range.  No deformity or crepitus noted.  Dorsiflexion  plantarflexion intact.  No palpable deficits of the Achilles tendon noted with plantar flexion and dorsiflexion.  No bony tenderness noted to distal, proximal tib-fib.  No tenderness palpation of the left lower extremity.  Skin:    General: Skin is warm and dry.     Comments: Good distal cap refill.  RLE is not dusky in appearance or cool to touch.  Neurological:     Mental Status: He is alert.     Comments: Sensation intact along major nerve distributions of BLE  Psychiatric:        Speech: Speech normal.        Behavior: Behavior normal.     ED Results / Procedures / Treatments   Labs (all labs ordered are listed, but only abnormal results are displayed) Labs Reviewed - No data to display  EKG None  Radiology DG Ankle Complete Right  Result Date: 03/07/2020 CLINICAL DATA:  Fall with right ankle swelling after basketball injury yesterday. EXAM: RIGHT ANKLE - COMPLETE 3+ VIEW COMPARISON:  None. FINDINGS: Ankle mortise is normal. No acute fracture or dislocation at the ankle. Evidence of minimally displaced transverse fracture of the base of the fifth metatarsal. Remainder of the exam is unremarkable. IMPRESSION: 1. Minimally displaced transverse fracture base of the fifth metatarsal. 2. No acute ankle injury. Electronically Signed   By: Elberta Fortis M.D.   On: 03/07/2020 13:18    Procedures Procedures (including critical care time)  Medications Ordered in ED Medications  HYDROcodone-acetaminophen (NORCO/VICODIN) 5-325 MG per tablet 1 tablet (1 tablet Oral Given 03/07/20 1447)    ED Course  I have reviewed the triage vital signs and the nursing notes.  Pertinent labs & imaging results that were available during my care of the patient were reviewed by me and considered in my medical decision making (see chart for details).    MDM Rules/Calculators/A&P                          24 year old male who presents for evaluation of right ankle and right foot pain status post  mechanical fall that occurred yesterday while playing basketball.  Difficulty ambulate bearing weight since then.  Initially arrival, he is afebrile, nontoxic-appearing.  Vital signs are stable.  He is neurovascularly intact.  On exam, he has obvious swelling noted to the right ankle and right foot.  Concern for fracture versus dislocation versus sprain.  X-rays ordered at triage.  X-ray shows minimally displaced transverse base fracture of the fifth metatarsal.  No acute ankle injury.  I discussed with patient regarding his x-ray findings.  I discussed with him that there still could be  a ligamentous injury that would not show on an x-ray.  We will plan to put him in a cam walker boot and crutches.  Patient instructed to follow-up with orthopedics as directed.  Patient reviewed on PMP.  Will give short course of pain medication. At this time, patient exhibits no emergent life-threatening condition that require further evaluation in ED or admission. Patient had ample opportunity for questions and discussion. All patient's questions were answered with full understanding. Strict return precautions discussed. Patient expresses understanding and agreement to plan.   Portions of this note were generated with Scientist, clinical (histocompatibility and immunogenetics). Dictation errors may occur despite best attempts at proofreading.  Final Clinical Impression(s) / ED Diagnoses Final diagnoses:  Sprain of right ankle, unspecified ligament, initial encounter  Nondisplaced fracture of fifth metatarsal bone, right foot, initial encounter for closed fracture    Rx / DC Orders ED Discharge Orders         Ordered    HYDROcodone-acetaminophen (NORCO/VICODIN) 5-325 MG tablet  Every 6 hours PRN     Discontinue  Reprint     03/07/20 1443           Maxwell Caul, PA-C 03/07/20 1457    Sabino Donovan, MD 03/08/20 [phone]

## 2020-03-07 NOTE — Discharge Instructions (Signed)
Follow the RICE (Rest, Ice, Compression, Elevation) protocol as directed.   Wear cam walker boot and use crutches for support and stabilization.  He should be nonweightbearing on that foot.  As we discussed, you will need to follow-up with orthopedics.  You can call their office for further evaluation.  You can take Tylenol or Ibuprofen as directed for pain. You can alternate Tylenol and Ibuprofen every 4 hours. If you take Tylenol at 1pm, then you can take Ibuprofen at 5pm. Then you can take Tylenol again at 9pm.   Take pain medications as directed for break through pain. Do not drive or operate machinery while taking this medication.   Return the emergency department in worsening pain, redness or swelling, numbness/weakness or any other worsening concerning symptoms.

## 2020-03-07 NOTE — ED Triage Notes (Signed)
Pt here after basketball injury yesterday to right ankle. Jumped and landed on a twisted ankle and felt it pop. Tried ice and compression and rest with no relief. Visibly swollen and bruised and cannot put weight on it.

## 2020-03-10 ENCOUNTER — Encounter: Payer: Self-pay | Admitting: Surgery

## 2020-03-10 ENCOUNTER — Ambulatory Visit: Payer: Self-pay

## 2020-03-10 ENCOUNTER — Telehealth: Payer: Self-pay | Admitting: Orthopaedic Surgery

## 2020-03-10 ENCOUNTER — Ambulatory Visit (INDEPENDENT_AMBULATORY_CARE_PROVIDER_SITE_OTHER): Payer: Self-pay | Admitting: Surgery

## 2020-03-10 VITALS — Ht 78.0 in | Wt 215.0 lb

## 2020-03-10 DIAGNOSIS — M79671 Pain in right foot: Secondary | ICD-10-CM

## 2020-03-10 DIAGNOSIS — S93401A Sprain of unspecified ligament of right ankle, initial encounter: Secondary | ICD-10-CM

## 2020-03-10 DIAGNOSIS — S92351A Displaced fracture of fifth metatarsal bone, right foot, initial encounter for closed fracture: Secondary | ICD-10-CM

## 2020-03-10 MED ORDER — TRAMADOL HCL 50 MG PO TABS: 50.0000 mg | ORAL_TABLET | Freq: Four times a day (QID) | ORAL | 0 refills | Status: DC | PRN

## 2020-03-10 NOTE — Telephone Encounter (Signed)
Patient called requesting an appt. Patient was seen at [facility] and referred to Dr. Ophelia Charter for sprained right ankle and fractured right pinky toe. Spoke with Triage nurse to work patient in. Per April send message to [facility] to work patient in to see PA Cornelius Moras. Please call patient at [phone].

## 2020-03-10 NOTE — Telephone Encounter (Signed)
I called patient and worked in to [employer] schedule.

## 2020-03-10 NOTE — Progress Notes (Signed)
Office Visit Note   Patient: Darren Finley           Date of Birth: [birth year]           MRN: [id number] Visit Date: 03/10/2020              Requested by: No referring provider defined for this encounter. PCP: Patient, No Pcp Per   Assessment & Plan: Visit Diagnoses:  1. Pain in right foot   2. Closed displaced fracture of fifth metatarsal bone of right foot, initial encounter   3. Sprain of unspecified ligament of right ankle, initial encounter     Plan: Today patient was put into a long cam boot.  Discontinue the short one that was given at the emergency room.  I advised patient to be nonweightbearing right foot do to the proximal 5th metatarsal fracture.  I would like him to follow-up with Dr. Lajoyce Corners in 1 week for evaluation and to discuss whether the 5th metatarsal fracture should be treated conservatively versus needing surgical intervention.  Discussed with patient that these fractures can be difficult to heal at times and we discussed the risk of a nonunion.  Advised that the boot will also manage the lateral ankle sprain that he has.  Continue elevation and ice off and on as needed.  I did send in a prescription for Ultram to his pharmacy.  All questions answered.  Follow-Up Instructions: Return in about 1 week (around 03/17/2020) for With Dr. Lajoyce Corners for evaluation of right proximal 5th metatarsal fracture and discuss treatment.   Orders:  Orders Placed This Encounter  Procedures  . XR Foot Complete Right   No orders of the defined types were placed in this encounter.     Procedures: No procedures performed   Clinical Data: No additional findings.   Subjective: Chief Complaint  Patient presents with  . Right Ankle - Pain    DOI 03/06/2020  . Right Foot - Pain    DOI 03/06/2020    HPI 24 year old male comes in today with complaints of right foot and ankle pain.  He was playing basketball March 06, 2020 when he jumped and landed and suffered an inversion injury to  his ankle.  He had marked discomfort attempting to weight-bear after the injury.  Has had some swelling and bruising.  He went to [facility] March 07, 2020 and had x-rays which showed minimally displaced transverse fracture base of the fifth metatarsal.  He was put in a short cam boot. Review of Systems No current cardiac pulmonary GI GU issues  Objective: Vital Signs: Ht 6\' 6"  (1.981 m)   Wt 215 lb (97.5 kg)   BMI 24.85 kg/m   Physical Exam HENT:     Head: Normocephalic.  Eyes:     Extraocular Movements: Extraocular movements intact.     Pupils: Pupils are equal, round, and reactive to light.  Pulmonary:     Effort: No respiratory distress.  Musculoskeletal:     Comments: Has some swelling around the proximal fifth metatarsal.  He is grossly tender over the fracture site as to be expected.  Neurovascular intact.  Neurological:     General: No focal deficit present.     Mental Status: He is alert.  Psychiatric:        Mood and Affect: Mood normal.        Behavior: Behavior normal.     Ortho Exam  Specialty Comments:  No specialty comments available.  Imaging: No  results found.   PMFS History: There are no problems to display for this patient.  Past Medical History:  Diagnosis Date  . Asthma     No family history on file.  No past surgical history on file. Social History   Occupational History  . Not on file  Tobacco Use  . Smoking status: Current Some Day Smoker    Types: Cigarettes  . Smokeless tobacco: Never Used  Vaping Use  . Vaping Use: Former  . Substances: Nicotine  Substance and Sexual Activity  . Alcohol use: Yes    Comment: occ  . Drug use: Not Currently    Types: Marijuana  . Sexual activity: Not on file

## 2020-03-18 ENCOUNTER — Encounter: Payer: Self-pay | Admitting: Orthopedic Surgery

## 2020-03-18 ENCOUNTER — Ambulatory Visit (INDEPENDENT_AMBULATORY_CARE_PROVIDER_SITE_OTHER): Payer: Self-pay | Admitting: Orthopedic Surgery

## 2020-03-18 VITALS — Ht 78.0 in | Wt 215.0 lb

## 2020-03-18 DIAGNOSIS — S92354A Nondisplaced fracture of fifth metatarsal bone, right foot, initial encounter for closed fracture: Secondary | ICD-10-CM

## 2020-03-18 NOTE — Progress Notes (Signed)
Office Visit Note   Patient: Darren Finley           Date of Birth: 12/12/1995           MRN: [id number] Visit Date: 03/18/2020              Requested by: No referring provider defined for this encounter. PCP: Patient, No Pcp Per  Chief Complaint  Patient presents with  . Right Foot - Injury    2.5 weeks ago right foot closed nondisplaced 5th MT fx       HPI: Patient is a 24 year old gentleman who presents for initial evaluation for a closed nondisplaced fracture of the metaphysis base of the fifth metatarsal right foot.  Patient states he was playing basketball jumped and rolled his ankle and felt a pop.  He is currently in a fracture boot with crutches.  Assessment & Plan: Visit Diagnoses:  1. Closed nondisplaced fracture of fifth metatarsal bone of right foot, initial encounter     Plan: We will continue immobilization and crutches for 3 weeks follow-up in 3 weeks with repeat three-view radiographs of the right foot at which time anticipate we can release him to a stiff soled sneaker that he currently has.  Patient was given instruction for Achilles stretching.  Follow-Up Instructions: Return in about 3 weeks (around 04/08/2020).   Ortho Exam  Patient is alert, oriented, no adenopathy, well-dressed, normal affect, normal respiratory effort. Examination patient has a good dorsalis pedis pulse he has good ankle good subtalar range of motion good dorsiflexion of the ankle.  The base of the fifth metatarsal is minimally tender to palpation.  Radiograph shows an essentially nondisplaced metaphyseal fracture of the base of the fifth metatarsal.  Imaging: No results found. No images are attached to the encounter.  Labs: Lab Results  Component Value Date   REPTSTATUS 11/09/2017 FINAL 11/07/2017   CULT  11/07/2017    NO GROUP A STREP (S.PYOGENES) ISOLATED Performed at [facility], [address], [place], [place] [address]      Lab Results  Component Value Date     ALBUMIN 4.3 04/27/2018    No results found for: MG No results found for: VD25OH  No results found for: PREALBUMIN CBC EXTENDED Latest Ref Rng & Units 04/27/2018 11/07/2017  WBC 4.0 - 10.5 K/uL 6.0 6.9  RBC 4.22 - 5.81 MIL/uL 6.02(H) 5.59  HGB 13.0 - 17.0 g/dL 81.0 17.5  HCT 39 - 52 % 46.0 42.6  PLT 150 - 400 K/uL 132(L) 95(L)  NEUTROABS 1.7 - 7.7 K/uL 3.7 -  LYMPHSABS 0.7 - 4.0 K/uL 1.6 -     Body mass index is 24.85 kg/m.  Orders:  No orders of the defined types were placed in this encounter.  No orders of the defined types were placed in this encounter.    Procedures: No procedures performed  Clinical Data: No additional findings.  ROS:  All other systems negative, except as noted in the HPI. Review of Systems  Objective: Vital Signs: Ht 6\' 6"  (1.981 m)   Wt 215 lb (97.5 kg)   BMI 24.85 kg/m   Specialty Comments:  No specialty comments available.  PMFS History: There are no problems to display for this patient.  Past Medical History:  Diagnosis Date  . Asthma     History reviewed. No pertinent family history.  History reviewed. No pertinent surgical history. Social History   Occupational History  . Not on file  Tobacco Use  .  Smoking status: Current Some Day Smoker    Types: Cigarettes  . Smokeless tobacco: Never Used  Vaping Use  . Vaping Use: Former  . Substances: Nicotine  Substance and Sexual Activity  . Alcohol use: Yes    Comment: occ  . Drug use: Not Currently    Types: Marijuana  . Sexual activity: Not on file

## 2020-04-08 ENCOUNTER — Other Ambulatory Visit: Payer: Self-pay

## 2020-04-08 ENCOUNTER — Ambulatory Visit (INDEPENDENT_AMBULATORY_CARE_PROVIDER_SITE_OTHER): Payer: Self-pay

## 2020-04-08 ENCOUNTER — Encounter: Payer: Self-pay | Admitting: Orthopedic Surgery

## 2020-04-08 ENCOUNTER — Ambulatory Visit (INDEPENDENT_AMBULATORY_CARE_PROVIDER_SITE_OTHER): Payer: Self-pay | Admitting: Physician Assistant

## 2020-04-08 DIAGNOSIS — S92354D Nondisplaced fracture of fifth metatarsal bone, right foot, subsequent encounter for fracture with routine healing: Secondary | ICD-10-CM

## 2020-04-08 NOTE — Progress Notes (Signed)
Office Visit Note   Patient: Darren Finley           Date of Birth: [birth year]           MRN: [id number] Visit Date: 04/08/2020              Requested by: No referring provider defined for this encounter. PCP: Patient, No Pcp Per  Chief Complaint  Patient presents with  . Right Foot - Follow-up      HPI: Patient presents today he is 4 weeks status post nonoperative avulsion fracture of the fifth metatarsal base.  He states he is doing fine and feeling much better.  He is discontinued his boot and is wearing an unsupportive flip-flop  Assessment & Plan: Visit Diagnoses:  1. Closed nondisplaced fracture of fifth metatarsal bone of right foot with routine healing, subsequent encounter     Plan: Patient should be wearing his tennis shoes that he bought last time.  He has agreed to do this.  He will work on some light ankle strengthening and proprioception.  He is to refrain from high-impact jumping sports for another 3 weeks.  Should follow-up at that time however if he is doing well he may advance activities as tolerated should be mindful of the sharp painful symptoms  Follow-Up Instructions: No follow-ups on file.   Ortho Exam  Patient is alert, oriented, no adenopathy, well-dressed, normal affect, normal respiratory effort. Focused examination of his foot demonstrates no swelling no cellulitis skin is in good condition distal CMS is intact minimally tender to palpation over the base of the fifth metatarsal he does have painless good eversion strength  Imaging: No results found. No images are attached to the encounter.  Labs: Lab Results  Component Value Date   REPTSTATUS 11/09/2017 FINAL 11/07/2017   CULT  11/07/2017    NO GROUP A STREP (S.PYOGENES) ISOLATED Performed at [facility], [address] [place], [place] [address]      Lab Results  Component Value Date   ALBUMIN 4.3 04/27/2018    No results found for: MG No results found for: VD25OH  No  results found for: PREALBUMIN CBC EXTENDED Latest Ref Rng & Units 04/27/2018 11/07/2017  WBC 4.0 - 10.5 K/uL 6.0 6.9  RBC 4.22 - 5.81 MIL/uL 6.02(H) 5.59  HGB 13.0 - 17.0 g/dL 92.1 19.4  HCT 39 - 52 % 46.0 42.6  PLT 150 - 400 K/uL 132(L) 95(L)  NEUTROABS 1.7 - 7.7 K/uL 3.7 -  LYMPHSABS 0.7 - 4.0 K/uL 1.6 -     There is no height or weight on file to calculate BMI.  Orders:  Orders Placed This Encounter  Procedures  . XR Foot Complete Right   No orders of the defined types were placed in this encounter.    Procedures: No procedures performed  Clinical Data: No additional findings.  ROS:  All other systems negative, except as noted in the HPI. Review of Systems  Objective: Vital Signs: There were no vitals taken for this visit.  Specialty Comments:  No specialty comments available.  PMFS History: There are no problems to display for this patient.  Past Medical History:  Diagnosis Date  . Asthma     No family history on file.  No past surgical history on file. Social History   Occupational History  . Not on file  Tobacco Use  . Smoking status: Current Some Day Smoker    Types: Cigarettes  . Smokeless tobacco: Never Used  Vaping Use  . Vaping Use: Former  . Substances: Nicotine  Substance and Sexual Activity  . Alcohol use: Yes    Comment: occ  . Drug use: Not Currently    Types: Marijuana  . Sexual activity: Not on file

## 2020-04-29 ENCOUNTER — Encounter: Payer: Self-pay | Admitting: Orthopedic Surgery

## 2020-04-29 ENCOUNTER — Ambulatory Visit (INDEPENDENT_AMBULATORY_CARE_PROVIDER_SITE_OTHER): Payer: Medicaid Other | Admitting: Orthopedic Surgery

## 2020-04-29 VITALS — Ht 78.0 in | Wt 215.0 lb

## 2020-04-29 DIAGNOSIS — S92354D Nondisplaced fracture of fifth metatarsal bone, right foot, subsequent encounter for fracture with routine healing: Secondary | ICD-10-CM

## 2020-04-29 NOTE — Progress Notes (Signed)
Office Visit Note   Patient: Darren Finley           Date of Birth: [birth year]           MRN: [id number] Visit Date: 04/29/2020              Requested by: No referring provider defined for this encounter. PCP: Patient, No Pcp Per  Chief Complaint  Patient presents with  . Right Foot - Follow-up      HPI: Patient is a 24 year old gentleman who presents in follow-up 7 weeks status post avulsion fracture base of the fifth metatarsal right foot metaphyseal bone.  Patient states that he has no pain with normal activities he is currently wearing flip-flops patient states he had a call from [facility] regarding a bone stimulator.  Assessment & Plan: Visit Diagnoses:  1. Closed nondisplaced fracture of fifth metatarsal bone of right foot with routine healing, subsequent encounter     Plan: Recommended stiff soled sneakers discussed that if he increases activities would use a carbon fiber plate under his orthotic.  Patient was given instructions for fascial strengthening.  Follow-Up Instructions: Return if symptoms worsen or fail to improve.   Ortho Exam  Patient is alert, oriented, no adenopathy, well-dressed, normal affect, normal respiratory effort. Examination patient has good range of motion the ankle and subtalar joint the base of the fifth metatarsal was nontender to palpation he has no weakness with eversion inversion plantar flexion or dorsiflexion of the foot.  Imaging: No results found. No images are attached to the encounter.  Labs: Lab Results  Component Value Date   REPTSTATUS 11/09/2017 FINAL 11/07/2017   CULT  11/07/2017    NO GROUP A STREP (S.PYOGENES) ISOLATED Performed at [facility], [address] [place], [place] [address]      Lab Results  Component Value Date   ALBUMIN 4.3 04/27/2018    No results found for: MG No results found for: VD25OH  No results found for: PREALBUMIN CBC EXTENDED Latest Ref Rng & Units 04/27/2018 11/07/2017  WBC  4.0 - 10.5 K/uL 6.0 6.9  RBC 4.22 - 5.81 MIL/uL 6.02(H) 5.59  HGB 13.0 - 17.0 g/dL 52.7 78.2  HCT 39 - 52 % 46.0 42.6  PLT 150 - 400 K/uL 132(L) 95(L)  NEUTROABS 1.7 - 7.7 K/uL 3.7 -  LYMPHSABS 0.7 - 4.0 K/uL 1.6 -     Body mass index is 24.85 kg/m.  Orders:  No orders of the defined types were placed in this encounter.  No orders of the defined types were placed in this encounter.    Procedures: No procedures performed  Clinical Data: No additional findings.  ROS:  All other systems negative, except as noted in the HPI. Review of Systems  Objective: Vital Signs: Ht 6\' 6"  (1.981 m)   Wt 215 lb (97.5 kg)   BMI 24.85 kg/m   Specialty Comments:  No specialty comments available.  PMFS History: There are no problems to display for this patient.  Past Medical History:  Diagnosis Date  . Asthma     History reviewed. No pertinent family history.  History reviewed. No pertinent surgical history. Social History   Occupational History  . Not on file  Tobacco Use  . Smoking status: Current Some Day Smoker    Types: Cigarettes  . Smokeless tobacco: Never Used  Vaping Use  . Vaping Use: Former  . Substances: Nicotine  Substance and Sexual Activity  . Alcohol use: Yes  Comment: occ  . Drug use: Not Currently    Types: Marijuana  . Sexual activity: Not on file

## 2020-05-30 ENCOUNTER — Emergency Department (HOSPITAL_COMMUNITY)
Admission: EM | Admit: 2020-05-30 | Discharge: 2020-05-31 | Disposition: A | Discharge status: Home / Self Care | Payer: Medicaid Other | Attending: Emergency Medicine | Admitting: Emergency Medicine

## 2020-05-30 ENCOUNTER — Encounter (HOSPITAL_COMMUNITY): Payer: Self-pay

## 2020-05-30 ENCOUNTER — Other Ambulatory Visit: Payer: Self-pay

## 2020-05-30 DIAGNOSIS — F17210 Nicotine dependence, cigarettes, uncomplicated: Secondary | ICD-10-CM | POA: Insufficient documentation

## 2020-05-30 DIAGNOSIS — J45998 Other asthma: Secondary | ICD-10-CM

## 2020-05-30 DIAGNOSIS — Z7951 Long term (current) use of inhaled steroids: Secondary | ICD-10-CM | POA: Insufficient documentation

## 2020-05-30 NOTE — ED Triage Notes (Signed)
Pt sts he is having issues with asthma. Pt out of albuterol inhaler medications.

## 2020-05-31 MED ORDER — ALBUTEROL SULFATE (2.5 MG/3ML) 0.083% IN NEBU
INHALATION_SOLUTION | RESPIRATORY_TRACT | 0 refills | Status: DC
Start: 2020-05-31 — End: 2021-06-26

## 2020-05-31 MED ORDER — DEXAMETHASONE SODIUM PHOSPHATE 10 MG/ML IJ SOLN
10.0000 mg | Freq: Once | INTRAMUSCULAR | Status: AC
  Administered 2020-05-31: 10 mg via INTRAMUSCULAR
  Filled 2020-05-31: qty 1

## 2020-05-31 MED ORDER — BUDESONIDE 90 MCG/ACT IN AEPB: INHALATION_SPRAY | RESPIRATORY_TRACT | 0 refills | Status: DC

## 2020-05-31 MED ORDER — ALBUTEROL SULFATE HFA 108 (90 BASE) MCG/ACT IN AERS
2.0000 | INHALATION_SPRAY | RESPIRATORY_TRACT | Status: DC | PRN
  Administered 2020-05-31: 2 via RESPIRATORY_TRACT
  Filled 2020-05-31: qty 6.7

## 2020-05-31 NOTE — ED Provider Notes (Signed)
[facility]-EMERGENCY DEPT Provider Note: Lowella Dell, MD, FACEP  CSN: [id number] MRN: [id number] ARRIVAL: 05/30/20 at 2220 ROOM: WA08/WA08   CHIEF COMPLAINT  Asthma   HISTORY OF PRESENT ILLNESS  05/31/20 12:31 AM Darren Finley is a 24 y.o. male with a history of asthma.  He is here with wheezing and shortness of breath that began yesterday.  He attributes his symptoms to change in weather.  Symptoms have not been severe but he does not have his inhalers or neb solution.  He has not had a fever.  He has had a cough.   Past Medical History:  Diagnosis Date  . Asthma     History reviewed. No pertinent surgical history.  No family history on file.  Social History   Tobacco Use  . Smoking status: Current Some Day Smoker    Types: Cigarettes  . Smokeless tobacco: Never Used  Vaping Use  . Vaping Use: Former  . Substances: Nicotine  Substance Use Topics  . Alcohol use: Yes    Comment: occ  . Drug use: Not Currently    Types: Marijuana    Prior to Admission medications   Medication Sig Start Date End Date Taking? Authorizing Provider  albuterol (PROVENTIL) (2.5 MG/3ML) 0.083% nebulizer solution Administer 2.5 mg (3 mL) via nebulizer every 6 hours as needed for wheezing or shortness of breath. 05/31/20   Josselyn Harkins, MD  Ascorbic Acid (VITAMIN C) 1000 MG tablet Take 1,000 mg by mouth daily.    [provider]  montelukast (SINGULAIR) 10 MG tablet Take 10 mg by mouth daily. 04/03/19   [provider]  Multiple Vitamin (MULTIVITAMIN WITH MINERALS) TABS tablet Take 1 tablet by mouth daily.    [provider]  Omega-3 Fatty Acids (FISH OIL) 1000 MG CAPS Take 2,000 mg by mouth daily.    [provider]  fluticasone (FLONASE) 50 MCG/ACT nasal spray Place 2 sprays into both nostrils daily. Patient not taking: Reported on 05/24/2018 04/27/18 07/02/19  Emi Holes, PA-C  loratadine (CLARITIN) 10 MG tablet Take 1 tablet (10 mg total) by mouth daily.  04/17/19 05/31/20  Palumbo, April, MD    Allergies Patient has no known allergies.   REVIEW OF SYSTEMS  Negative except as noted here or in the History of Present Illness.   PHYSICAL EXAMINATION  Initial Vital Signs Blood pressure 122/81, pulse 81, temperature 97.9 F (36.6 C), temperature source Oral, resp. rate 14, height 6\' 6"  (1.981 m), weight 99.8 kg, SpO2 96 %.  Examination General: Well-developed, well-nourished male in no acute distress; appearance consistent with age of record HENT: normocephalic; atraumatic Eyes: pupils equal, round and reactive to light; extraocular muscles intact Neck: supple Heart: regular rate and rhythm; no murmurs, rubs or gallops Lungs: clear to auscultation bilaterally Abdomen: soft; nondistended; nontender; no masses or hepatosplenomegaly; bowel sounds present Extremities: No deformity; full range of motion; pulses normal Neurologic: Awake, alert and oriented; motor function intact in all extremities and symmetric; no facial droop Skin: Warm and dry Psychiatric: Normal mood and affect   RESULTS  Summary of this visit's results, reviewed and interpreted by myself:   EKG Interpretation  Date/Time:    Ventricular Rate:    PR Interval:    QRS Duration:   QT Interval:    QTC Calculation:   R Axis:     Text Interpretation:        Laboratory Studies: No results found for this or any previous visit (from the past 24 hour(s)). Imaging  Studies: No results found.  ED COURSE and MDM  Nursing notes, initial and subsequent vitals signs, including pulse oximetry, reviewed and interpreted by myself.  Vitals:   05/30/20 2227 05/30/20 2233 05/31/20 0000  BP: (!) 144/73  122/81  Pulse: 83  81  Resp: 16  14  Temp: 97.9 F (36.6 C)    TempSrc: Oral    SpO2: 99%  96%  Weight:  99.8 kg   Height:  6\' 6"  (1.981 m)    Medications  albuterol (VENTOLIN HFA) 108 (90 Base) MCG/ACT inhaler 2 puff (has no administration in time range)    dexamethasone (DECADRON) injection 10 mg (has no administration in time range)   We will provide the patient with a new inhaler and refill his albuterol.  We will also start him on Pulmicort.   PROCEDURES  Procedures   ED DIAGNOSES     ICD-10-CM   1. Seasonal asthma  J45.998        Auriana Scalia, MD 05/31/20 [phone]

## 2020-07-03 ENCOUNTER — Emergency Department (HOSPITAL_COMMUNITY)
Admission: EM | Admit: 2020-07-03 | Discharge: 2020-07-03 | Discharge status: Against Medical Advice | Payer: Medicaid Other | Attending: Emergency Medicine | Admitting: Emergency Medicine

## 2020-07-03 ENCOUNTER — Other Ambulatory Visit: Payer: Self-pay

## 2021-06-26 ENCOUNTER — Emergency Department (HOSPITAL_BASED_OUTPATIENT_CLINIC_OR_DEPARTMENT_OTHER): Payer: Medicaid Other

## 2021-06-26 ENCOUNTER — Other Ambulatory Visit: Payer: Self-pay

## 2021-06-26 ENCOUNTER — Emergency Department (HOSPITAL_BASED_OUTPATIENT_CLINIC_OR_DEPARTMENT_OTHER)
Admission: EM | Admit: 2021-06-26 | Discharge: 2021-06-26 | Disposition: A | Discharge status: Home / Self Care | Payer: Medicaid Other | Attending: Emergency Medicine | Admitting: Emergency Medicine

## 2021-06-26 ENCOUNTER — Encounter (HOSPITAL_BASED_OUTPATIENT_CLINIC_OR_DEPARTMENT_OTHER): Payer: Self-pay

## 2021-06-26 DIAGNOSIS — F17210 Nicotine dependence, cigarettes, uncomplicated: Secondary | ICD-10-CM | POA: Insufficient documentation

## 2021-06-26 DIAGNOSIS — Z20822 Contact with and (suspected) exposure to covid-19: Secondary | ICD-10-CM | POA: Insufficient documentation

## 2021-06-26 DIAGNOSIS — Z79899 Other long term (current) drug therapy: Secondary | ICD-10-CM | POA: Insufficient documentation

## 2021-06-26 DIAGNOSIS — J4521 Mild intermittent asthma with (acute) exacerbation: Secondary | ICD-10-CM | POA: Insufficient documentation

## 2021-06-26 DIAGNOSIS — J45901 Unspecified asthma with (acute) exacerbation: Secondary | ICD-10-CM

## 2021-06-26 LAB — RESP PANEL BY RT-PCR (FLU A&B, COVID) ARPGX2
Influenza A by PCR: NEGATIVE
Influenza B by PCR: NEGATIVE
SARS Coronavirus 2 by RT PCR: NEGATIVE

## 2021-06-26 MED ORDER — PREDNISONE 20 MG PO TABS
60.0000 mg | ORAL_TABLET | Freq: Every day | ORAL | 0 refills | Status: AC
Start: 2021-06-26 — End: 2021-06-30

## 2021-06-26 MED ORDER — ALBUTEROL SULFATE HFA 108 (90 BASE) MCG/ACT IN AERS
4.0000 | INHALATION_SPRAY | Freq: Once | RESPIRATORY_TRACT | Status: AC
  Administered 2021-06-26: 11:00:00 4 via RESPIRATORY_TRACT
  Filled 2021-06-26: qty 6.7

## 2021-06-26 MED ORDER — IPRATROPIUM BROMIDE HFA 17 MCG/ACT IN AERS
2.0000 | INHALATION_SPRAY | Freq: Once | RESPIRATORY_TRACT | Status: AC
  Administered 2021-06-26: 11:00:00 2 via RESPIRATORY_TRACT
  Filled 2021-06-26: qty 12.9

## 2021-06-26 MED ORDER — PREDNISONE 50 MG PO TABS
60.0000 mg | ORAL_TABLET | Freq: Once | ORAL | Status: AC
  Administered 2021-06-26: 60 mg via ORAL
  Filled 2021-06-26: qty 1

## 2021-06-26 MED ORDER — ALBUTEROL SULFATE HFA 108 (90 BASE) MCG/ACT IN AERS
2.0000 | INHALATION_SPRAY | RESPIRATORY_TRACT | 2 refills | Status: DC | PRN
Start: 2021-06-26 — End: 2021-10-09

## 2021-06-26 MED ORDER — ALBUTEROL SULFATE (2.5 MG/3ML) 0.083% IN NEBU
INHALATION_SOLUTION | RESPIRATORY_TRACT | 2 refills | Status: DC
Start: 2021-06-26 — End: 2021-08-18

## 2021-06-26 MED ORDER — ALBUTEROL SULFATE (2.5 MG/3ML) 0.083% IN NEBU
INHALATION_SOLUTION | RESPIRATORY_TRACT | 2 refills | Status: DC
Start: 2021-06-26 — End: 2021-06-26

## 2021-06-26 NOTE — ED Triage Notes (Signed)
Pt c/o asthma attack x 1 week. States ran out of inhalers. Wheezing worse in the morning. Denies flu like symptoms.

## 2021-06-26 NOTE — ED Provider Notes (Signed)
[facility] EMERGENCY DEPARTMENT Provider Note   CSN: [id number] Arrival date & time: 06/26/21  1024     History Chief Complaint  Patient presents with   Shortness of Breath   Asthma    Darren Finley is a 25 y.o. male.  The history is provided by the patient.  Shortness of Breath Severity:  Mild Onset quality:  Gradual Duration:  2 days Timing:  Intermittent Progression:  Waxing and waning Chronicity:  New Context comment:  Asthma Relieved by:  Nothing Worsened by:  Nothing Associated symptoms: wheezing   Associated symptoms: no abdominal pain, no chest pain, no cough, no fever and no sore throat   Asthma Associated symptoms include shortness of breath. Pertinent negatives include no chest pain and no abdominal pain.      Past Medical History:  Diagnosis Date   Asthma     There are no problems to display for this patient.   History reviewed. No pertinent surgical history.     History reviewed. No pertinent family history.  Social History   Tobacco Use   Smoking status: Some Days    Types: Cigarettes   Smokeless tobacco: Never  Vaping Use   Vaping Use: Former   Substances: Nicotine  Substance Use Topics   Alcohol use: Yes    Comment: occ   Drug use: Not Currently    Types: Marijuana    Home Medications Prior to Admission medications   Medication Sig Start Date End Date Taking? Authorizing Provider  predniSONE (DELTASONE) 20 MG tablet Take 3 tablets (60 mg total) by mouth daily for 4 days. 06/26/21 06/30/21 Yes Neveyah Garzon, DO  albuterol (PROVENTIL) (2.5 MG/3ML) 0.083% nebulizer solution Administer 2.5 mg (3 mL) via nebulizer every 6 hours as needed for wheezing or shortness of breath. 05/31/20   Molpus, John, MD  Ascorbic Acid (VITAMIN C) 1000 MG tablet Take 1,000 mg by mouth daily.    [provider]  Budesonide 90 MCG/ACT inhaler Take 2 puffs twice daily. 05/31/20   Molpus, John, MD  montelukast (SINGULAIR) 10 MG tablet Take  10 mg by mouth daily. 04/03/19   [provider]  Multiple Vitamin (MULTIVITAMIN WITH MINERALS) TABS tablet Take 1 tablet by mouth daily.    [provider]  Omega-3 Fatty Acids (FISH OIL) 1000 MG CAPS Take 2,000 mg by mouth daily.    [provider]  fluticasone (FLONASE) 50 MCG/ACT nasal spray Place 2 sprays into both nostrils daily. Patient not taking: Reported on 05/24/2018 04/27/18 07/02/19  Emi Holes, PA-C  loratadine (CLARITIN) 10 MG tablet Take 1 tablet (10 mg total) by mouth daily. 04/17/19 05/31/20  Palumbo, April, MD    Allergies    Patient has no known allergies.  Review of Systems   Review of Systems  Constitutional:  Negative for fever.  HENT:  Negative for sore throat.   Respiratory:  Positive for shortness of breath and wheezing. Negative for cough.   Cardiovascular:  Negative for chest pain.  Gastrointestinal:  Negative for abdominal pain.   Physical Exam Updated Vital Signs  ED Triage Vitals  Enc Vitals Group     BP 06/26/21 1032 (!) 163/63     Pulse Rate 06/26/21 1032 84     Resp 06/26/21 1032 18     Temp 06/26/21 1031 98.3 F (36.8 C)     Temp Source 06/26/21 1031 Oral     SpO2 06/26/21 1032 99 %     Weight 06/26/21 1032 250 lb (  113.4 kg)     Height 06/26/21 1032 6\' 6"  (1.981 m)     Head Circumference --      Peak Flow --      Pain Score 06/26/21 1031 0     Pain Loc --      Pain Edu? --      Excl. in GC? --      Physical Exam Constitutional:      General: He is not in acute distress.    Appearance: He is not ill-appearing.  HENT:     Mouth/Throat:     Mouth: Mucous membranes are moist.  Eyes:     Pupils: Pupils are equal, round, and reactive to light.  Cardiovascular:     Pulses: Normal pulses.  Pulmonary:     Effort: Pulmonary effort is normal. No tachypnea.     Breath sounds: Wheezing (faint end expiratory wheeze) present. No decreased breath sounds.  Musculoskeletal:     Cervical back: Normal range of motion.   Neurological:     Mental Status: He is alert.    ED Results / Procedures / Treatments   Labs (all labs ordered are listed, but only abnormal results are displayed) Labs Reviewed  RESP PANEL BY RT-PCR (FLU A&B, COVID) ARPGX2    EKG None  Radiology No results found.  Procedures Procedures   Medications Ordered in ED Medications  albuterol (VENTOLIN HFA) 108 (90 Base) MCG/ACT inhaler 4 puff (4 puffs Inhalation Given 06/26/21 1040)  ipratropium (ATROVENT HFA) inhaler 2 puff (2 puffs Inhalation Given 06/26/21 1040)  predniSONE (DELTASONE) tablet 60 mg (60 mg Oral Given 06/26/21 1043)    ED Course  I have reviewed the triage vital signs and the nursing notes.  Pertinent labs & imaging results that were available during my care of the patient were reviewed by me and considered in my medical decision making (see chart for details).    MDM Rules/Calculators/A&P                           Roye Gustafson is here with shortness of breath and wheezing.  Normal vitals.  No fever.  No signs of respiratory distress.  Very fine and expiratory wheezing.  Suspect from weather changes.  Has not had inhaler because he ran out.  Given inhaler and steroids.  No sputum production or fever and doubt pneumonia.  Will swab for COVID and flu.  Discharged in good condition.  Very well-appearing.  This chart was dictated using voice recognition software.  Despite best efforts to proofread,  errors can occur which can change the documentation meaning.   Final Clinical Impression(s) / ED Diagnoses Final diagnoses:  Mild asthma with exacerbation, unspecified whether persistent    Rx / DC Orders ED Discharge Orders          Ordered    predniSONE (DELTASONE) 20 MG tablet  Daily        06/26/21 1041             [employer], DO 06/26/21 1043

## 2021-06-26 NOTE — ED Notes (Signed)
ED Provider at bedside. 

## 2021-06-26 NOTE — Discharge Instructions (Signed)
Recommend 4 puffs of your albuterol inhaler every 4 hours for the next 24 hours while awake and then as needed.  Take your next dose of steroid tomorrow.  Follow-up your viral testing on your MyChart.

## 2021-07-29 ENCOUNTER — Other Ambulatory Visit: Payer: Self-pay

## 2021-07-29 ENCOUNTER — Emergency Department (HOSPITAL_BASED_OUTPATIENT_CLINIC_OR_DEPARTMENT_OTHER)
Admission: EM | Admit: 2021-07-29 | Discharge: 2021-07-29 | Disposition: A | Discharge status: Home / Self Care | Payer: Self-pay | Attending: Emergency Medicine | Admitting: Emergency Medicine

## 2021-07-29 ENCOUNTER — Emergency Department (HOSPITAL_BASED_OUTPATIENT_CLINIC_OR_DEPARTMENT_OTHER): Payer: Self-pay

## 2021-07-29 ENCOUNTER — Encounter (HOSPITAL_BASED_OUTPATIENT_CLINIC_OR_DEPARTMENT_OTHER): Payer: Self-pay

## 2021-07-29 DIAGNOSIS — Z5321 Procedure and treatment not carried out due to patient leaving prior to being seen by health care provider: Secondary | ICD-10-CM | POA: Insufficient documentation

## 2021-07-29 DIAGNOSIS — R0789 Other chest pain: Secondary | ICD-10-CM | POA: Insufficient documentation

## 2021-07-29 DIAGNOSIS — R0602 Shortness of breath: Secondary | ICD-10-CM | POA: Insufficient documentation

## 2021-07-29 MED ORDER — ALBUTEROL SULFATE HFA 108 (90 BASE) MCG/ACT IN AERS: 2.0000 | INHALATION_SPRAY | RESPIRATORY_TRACT | Status: DC | PRN

## 2021-07-29 MED ORDER — IPRATROPIUM-ALBUTEROL 0.5-2.5 (3) MG/3ML IN SOLN
RESPIRATORY_TRACT | Status: AC
  Administered 2021-07-29: 3 mL
  Filled 2021-07-29: qty 3

## 2021-07-29 MED ORDER — ALBUTEROL (5 MG/ML) CONTINUOUS INHALATION SOLN
INHALATION_SOLUTION | RESPIRATORY_TRACT | Status: AC
  Administered 2021-07-29: 2.5 mg
  Filled 2021-07-29: qty 0.5

## 2021-07-29 NOTE — ED Triage Notes (Signed)
Pt arrives with reports of shortness of breath and some chest tightness X1 month States he was seen for the same about a month ago, ran out of his inhaler. Reports he was using it a lot with neb but states that nothing was working.

## 2021-07-29 NOTE — Progress Notes (Signed)
RT saw patient in triage. BBS wheezes. I saw him his past visit and he presents as the same. Albuterol and Atrovent neb given. RT to monitor.

## 2021-08-18 ENCOUNTER — Emergency Department (HOSPITAL_BASED_OUTPATIENT_CLINIC_OR_DEPARTMENT_OTHER)
Admission: EM | Admit: 2021-08-18 | Discharge: 2021-08-18 | Disposition: A | Discharge status: Home / Self Care | Payer: Medicaid Other | Attending: Emergency Medicine | Admitting: Emergency Medicine

## 2021-08-18 ENCOUNTER — Encounter (HOSPITAL_BASED_OUTPATIENT_CLINIC_OR_DEPARTMENT_OTHER): Payer: Self-pay

## 2021-08-18 ENCOUNTER — Other Ambulatory Visit: Payer: Self-pay

## 2021-08-18 DIAGNOSIS — J45909 Unspecified asthma, uncomplicated: Secondary | ICD-10-CM | POA: Insufficient documentation

## 2021-08-18 DIAGNOSIS — J4521 Mild intermittent asthma with (acute) exacerbation: Secondary | ICD-10-CM

## 2021-08-18 DIAGNOSIS — R0602 Shortness of breath: Secondary | ICD-10-CM | POA: Insufficient documentation

## 2021-08-18 DIAGNOSIS — Z20822 Contact with and (suspected) exposure to covid-19: Secondary | ICD-10-CM | POA: Insufficient documentation

## 2021-08-18 DIAGNOSIS — R059 Cough, unspecified: Secondary | ICD-10-CM | POA: Insufficient documentation

## 2021-08-18 DIAGNOSIS — Z7951 Long term (current) use of inhaled steroids: Secondary | ICD-10-CM | POA: Insufficient documentation

## 2021-08-18 LAB — CBC WITH DIFFERENTIAL/PLATELET
Abs Immature Granulocytes: 0.02 10*3/uL (ref 0.00–0.07)
Basophils Absolute: 0.1 10*3/uL (ref 0.0–0.1)
Basophils Relative: 1 %
Eosinophils Absolute: 0.2 10*3/uL (ref 0.0–0.5)
Eosinophils Relative: 3 %
HCT: 44.1 % (ref 39.0–52.0)
Hemoglobin: 14.8 g/dL (ref 13.0–17.0)
Immature Granulocytes: 0 %
Lymphocytes Relative: 30 %
Lymphs Abs: 2 10*3/uL (ref 0.7–4.0)
MCH: 26.4 pg (ref 26.0–34.0)
MCHC: 33.6 g/dL (ref 30.0–36.0)
MCV: 78.8 fL — ABNORMAL LOW (ref 80.0–100.0)
Monocytes Absolute: 0.6 10*3/uL (ref 0.1–1.0)
Monocytes Relative: 8 %
Neutro Abs: 3.9 10*3/uL (ref 1.7–7.7)
Neutrophils Relative %: 58 %
Platelets: 123 10*3/uL — ABNORMAL LOW (ref 150–400)
RBC: 5.6 MIL/uL (ref 4.22–5.81)
RDW: 12.6 % (ref 11.5–15.5)
WBC: 6.8 10*3/uL (ref 4.0–10.5)
nRBC: 0 % (ref 0.0–0.2)

## 2021-08-18 LAB — BASIC METABOLIC PANEL
Anion gap: 10 (ref 5–15)
BUN: 13 mg/dL (ref 6–20)
CO2: 25 mmol/L (ref 22–32)
Calcium: 9.3 mg/dL (ref 8.9–10.3)
Chloride: 104 mmol/L (ref 98–111)
Creatinine, Ser: 1.04 mg/dL (ref 0.61–1.24)
GFR, Estimated: >60 mL/min (ref >60)
Glucose, Bld: 94 mg/dL (ref 70–99)
Potassium: 3.6 mmol/L (ref 3.5–5.1)
Sodium: 139 mmol/L (ref 135–145)

## 2021-08-18 LAB — RESP PANEL BY RT-PCR (FLU A&B, COVID) ARPGX2
Influenza A by PCR: NEGATIVE
Influenza B by PCR: NEGATIVE
SARS Coronavirus 2 by RT PCR: NEGATIVE

## 2021-08-18 MED ORDER — ALBUTEROL SULFATE HFA 108 (90 BASE) MCG/ACT IN AERS
2.0000 | INHALATION_SPRAY | Freq: Once | RESPIRATORY_TRACT | Status: DC
  Filled 2021-08-18: qty 6.7

## 2021-08-18 MED ORDER — ALBUTEROL SULFATE (2.5 MG/3ML) 0.083% IN NEBU
5.0000 mg | INHALATION_SOLUTION | Freq: Once | RESPIRATORY_TRACT | Status: AC
  Administered 2021-08-18: 5 mg via RESPIRATORY_TRACT
  Filled 2021-08-18: qty 6

## 2021-08-18 MED ORDER — PREDNISONE 50 MG PO TABS: 50.0000 mg | ORAL_TABLET | Freq: Every day | ORAL | 0 refills | Status: AC

## 2021-08-18 MED ORDER — IPRATROPIUM BROMIDE 0.02 % IN SOLN
0.5000 mg | Freq: Once | RESPIRATORY_TRACT | Status: AC
  Administered 2021-08-18: 0.5 mg via RESPIRATORY_TRACT
  Filled 2021-08-18: qty 2.5

## 2021-08-18 MED ORDER — SODIUM CHLORIDE 0.9 % IV BOLUS
1000.0000 mL | Freq: Once | INTRAVENOUS | Status: AC
  Administered 2021-08-18: 1000 mL via INTRAVENOUS

## 2021-08-18 MED ORDER — ALBUTEROL SULFATE (2.5 MG/3ML) 0.083% IN NEBU: INHALATION_SOLUTION | RESPIRATORY_TRACT | 0 refills | Status: DC

## 2021-08-18 MED ORDER — METHYLPREDNISOLONE SODIUM SUCC 125 MG IJ SOLR
125.0000 mg | Freq: Once | INTRAMUSCULAR | Status: AC
  Administered 2021-08-18: 125 mg via INTRAVENOUS
  Filled 2021-08-18: qty 2

## 2021-08-18 NOTE — Discharge Instructions (Addendum)
You likely have a asthma attack.  Your COVID and flu test were negative  Please use albuterol every 4-6 hours as needed for cough or wheezing  Take prednisone as prescribed  Follow-up with your doctor  Return to ER if you have worse wheezing, trouble breathing, fever.

## 2021-08-18 NOTE — ED Provider Notes (Signed)
[facility] EMERGENCY DEPARTMENT Provider Note   CSN: [id number] Arrival date & time: 08/18/21  2010     History  Chief Complaint  Patient presents with   Cough    Darren Finley is a 26 y.o. male here presenting with cough and wheezing.  Patient has a history of asthma and states that he ran out of his albuterol inhaler about a week ago.  He states that he has been having some shortness of breath and flulike symptoms for the last 3 to 4 days.  Patient states that he can hear himself wheezing.  He states that he does have asthma exacerbation especially in the winter.  Denies any fever or productive cough.  Denies any COVID exposure.  Patient states that he did not have any recent hospitalizations for asthma exacerbation  The history is provided by the patient.      Home Medications Prior to Admission medications   Medication Sig Start Date End Date Taking? Authorizing Provider  albuterol (PROVENTIL) (2.5 MG/3ML) 0.083% nebulizer solution Administer 2.5 mg (3 mL) via nebulizer every 6 hours as needed for wheezing or shortness of breath. 06/26/21   Curatolo, Adam, DO  albuterol (VENTOLIN HFA) 108 (90 Base) MCG/ACT inhaler Inhale 2 puffs into the lungs every 4 (four) hours as needed for wheezing or shortness of breath. 06/26/21   Curatolo, Adam, DO  Ascorbic Acid (VITAMIN C) 1000 MG tablet Take 1,000 mg by mouth daily.    [provider]  Budesonide 90 MCG/ACT inhaler Take 2 puffs twice daily. 05/31/20   Molpus, John, MD  montelukast (SINGULAIR) 10 MG tablet Take 10 mg by mouth daily. 04/03/19   [provider]  Multiple Vitamin (MULTIVITAMIN WITH MINERALS) TABS tablet Take 1 tablet by mouth daily.    [provider]  Omega-3 Fatty Acids (FISH OIL) 1000 MG CAPS Take 2,000 mg by mouth daily.    [provider]  fluticasone (FLONASE) 50 MCG/ACT nasal spray Place 2 sprays into both nostrils daily. Patient not taking: Reported on 05/24/2018 04/27/18  07/02/19  Emi Holes, PA-C  loratadine (CLARITIN) 10 MG tablet Take 1 tablet (10 mg total) by mouth daily. 04/17/19 05/31/20  Palumbo, April, MD      Allergies    Patient has no known allergies.    Review of Systems   Review of Systems  Respiratory:  Positive for cough and wheezing.   All other systems reviewed and are negative.  Physical Exam Updated Vital Signs BP 118/79 (BP Location: Left Arm)    Pulse 82    Temp 98 F (36.7 C) (Oral)    Resp (!) 24    Ht 6\' 6"  (1.981 m)    Wt 118.4 kg    SpO2 96%    BMI 30.16 kg/m  Physical Exam Vitals and nursing note reviewed.  Constitutional:      Appearance: Normal appearance.  HENT:     Head: Normocephalic.     Nose: Nose normal.     Mouth/Throat:     Mouth: Mucous membranes are moist.  Eyes:     Extraocular Movements: Extraocular movements intact.     Pupils: Pupils are equal, round, and reactive to light.  Cardiovascular:     Rate and Rhythm: Normal rate and regular rhythm.     Pulses: Normal pulses.  Pulmonary:     Comments: Tachypneic, patient has diffuse wheezing.  No retractions.  Patient is talking in full sentences Abdominal:     General: Abdomen is  flat.     Palpations: Abdomen is soft.  Musculoskeletal:        General: Normal range of motion.     Cervical back: Normal range of motion and neck supple.  Skin:    General: Skin is warm.     Capillary Refill: Capillary refill takes less than 2 seconds.  Neurological:     General: No focal deficit present.     Mental Status: He is alert and oriented to person, place, and time.  Psychiatric:        Mood and Affect: Mood normal.        Behavior: Behavior normal.    ED Results / Procedures / Treatments   Labs (all labs ordered are listed, but only abnormal results are displayed) Labs Reviewed  RESP PANEL BY RT-PCR (FLU A&B, COVID) ARPGX2  CBC WITH DIFFERENTIAL/PLATELET  BASIC METABOLIC PANEL    EKG None  Radiology No results found.  Procedures Procedures     Medications Ordered in ED Medications  albuterol (PROVENTIL) (2.5 MG/3ML) 0.083% nebulizer solution 5 mg (has no administration in time range)  ipratropium (ATROVENT) nebulizer solution 0.5 mg (has no administration in time range)  methylPREDNISolone sodium succinate (SOLU-MEDROL) 125 mg/2 mL injection 125 mg (125 mg Intravenous Given 08/18/21 2042)  sodium chloride 0.9 % bolus 1,000 mL (1,000 mLs Intravenous New Bag/Given 08/18/21 2041)    ED Course/ Medical Decision Making/ A&P                           Medical Decision Making Darren Finley is a 26 y.o. male here with cough and wheezing.  Patient has been having flulike symptoms for several days.  Patient has a history of asthma.  Consider asthma exacerbation versus flu or RSV.  Low suspicion for pneumonia and patient does not have a productive cough or fever.  Plan to get CBC and BMP and give Solu-Medrol and albuterol and reassess  9:44 PM CBC and BMP reviewed and they were unremarkable.  COVID and flu tests are negative.  I reassessed patient.  Patient has minimal wheezing after albuterol and Solu-Medrol.  Patient is stable for discharge.  Patient request higher dose of prednisone and since he is 120 kg, will give prednisone 50 mg daily for 5 days.  Gave strict return precautions.  I also refilled his albuterol  Amount and/or Complexity of Data Reviewed External Data Reviewed: notes. Labs: ordered. Decision-making details documented in ED Course. Radiology: ordered and independent interpretation performed. Decision-making details documented in ED Course.  Risk OTC drugs. Prescription drug management.   Final Clinical Impression(s) / ED Diagnoses Final diagnoses:  None    Rx / DC Orders ED Discharge Orders     None         Charlynne Pander, MD 08/18/21 2145

## 2021-08-18 NOTE — ED Triage Notes (Addendum)
Pt c/o flu like sx 3-4 days-states he is out of asthma med/inhaler-no relief with neb tx ~2 hours PTA-NAD-steady gait

## 2021-09-09 ENCOUNTER — Emergency Department (HOSPITAL_BASED_OUTPATIENT_CLINIC_OR_DEPARTMENT_OTHER)
Admission: EM | Admit: 2021-09-09 | Discharge: 2021-09-09 | Disposition: A | Discharge status: Home / Self Care | Payer: Medicaid Other | Attending: Emergency Medicine | Admitting: Emergency Medicine

## 2021-09-09 ENCOUNTER — Other Ambulatory Visit: Payer: Self-pay

## 2021-09-09 ENCOUNTER — Encounter (HOSPITAL_BASED_OUTPATIENT_CLINIC_OR_DEPARTMENT_OTHER): Payer: Self-pay

## 2021-09-09 DIAGNOSIS — Z7952 Long term (current) use of systemic steroids: Secondary | ICD-10-CM | POA: Insufficient documentation

## 2021-09-09 DIAGNOSIS — Z7951 Long term (current) use of inhaled steroids: Secondary | ICD-10-CM | POA: Insufficient documentation

## 2021-09-09 DIAGNOSIS — J45901 Unspecified asthma with (acute) exacerbation: Secondary | ICD-10-CM | POA: Insufficient documentation

## 2021-09-09 MED ORDER — ALBUTEROL SULFATE HFA 108 (90 BASE) MCG/ACT IN AERS
2.0000 | INHALATION_SPRAY | Freq: Four times a day (QID) | RESPIRATORY_TRACT | 6 refills | Status: DC | PRN
Start: 2021-09-09 — End: 2021-10-09

## 2021-09-09 MED ORDER — PREDNISONE 10 MG PO TABS: ORAL_TABLET | ORAL | 0 refills | Status: AC

## 2021-09-09 MED ORDER — METHYLPREDNISOLONE SODIUM SUCC 125 MG IJ SOLR
125.0000 mg | Freq: Every day | INTRAMUSCULAR | Status: DC
  Administered 2021-09-09: 125 mg via INTRAVENOUS
  Filled 2021-09-09: qty 2

## 2021-09-09 MED ORDER — IPRATROPIUM-ALBUTEROL 0.5-2.5 (3) MG/3ML IN SOLN
RESPIRATORY_TRACT | Status: AC
  Administered 2021-09-09: 3 mL
  Filled 2021-09-09: qty 3

## 2021-09-09 MED ORDER — MAGNESIUM SULFATE 2 GM/50ML IV SOLN
2.0000 g | Freq: Once | INTRAVENOUS | Status: AC
  Administered 2021-09-09: 2 g via INTRAVENOUS
  Filled 2021-09-09: qty 50

## 2021-09-09 MED ORDER — IPRATROPIUM-ALBUTEROL 0.5-2.5 (3) MG/3ML IN SOLN
3.0000 mL | Freq: Once | RESPIRATORY_TRACT | Status: AC
  Administered 2021-09-09: 3 mL via RESPIRATORY_TRACT
  Filled 2021-09-09: qty 3

## 2021-09-09 MED ORDER — ALBUTEROL (5 MG/ML) CONTINUOUS INHALATION SOLN
INHALATION_SOLUTION | RESPIRATORY_TRACT | Status: AC
  Administered 2021-09-09: 2.5 mg
  Filled 2021-09-09: qty 0.5

## 2021-09-09 MED ORDER — ALBUTEROL SULFATE (5 MG/ML) 0.5% IN NEBU: 2.5000 mg | INHALATION_SOLUTION | Freq: Four times a day (QID) | RESPIRATORY_TRACT | 12 refills | Status: DC | PRN

## 2021-09-09 NOTE — Discharge Instructions (Signed)
For the next 2 days (Saturday and Sunday), you should give yourself an albuterol nebulizer treatment every 4 hours while you are awake.  This includes tonight when you get back from the emergency department.  Your next dose of prednisone and steroids are due tomorrow morning.  You will take these on a long taper as prescribed.

## 2021-09-09 NOTE — ED Provider Notes (Signed)
[facility] EMERGENCY DEPARTMENT Provider Note   CSN: [id number] Arrival date & time: 09/09/21  1437     History  Chief Complaint  Patient presents with   Shortness of Breath    Darren Finley is a 26 y.o. male presenting to ED with shortness of breath.  Hx of asthma.  Does not have a PCP.  Says weather changes are a trigger.  Treated for asthma flare 1 month ago in setting of viral illness, felt better for a week on steroids but then wheezing began when he finished.  He has used albuterol inhaler and nebulizer solution at home (running out of both soon).  Still SOB.  Feels better after triage breathing treatment.  HPI     Home Medications Prior to Admission medications   Medication Sig Start Date End Date Taking? Authorizing Provider  albuterol (PROVENTIL) (5 MG/ML) 0.5% nebulizer solution Take 0.5 mLs (2.5 mg total) by nebulization every 6 (six) hours as needed for wheezing or shortness of breath. 09/09/21  Yes Dezeray Puccio, Kermit Balo, MD  albuterol (VENTOLIN HFA) 108 (90 Base) MCG/ACT inhaler Inhale 2 puffs into the lungs every 6 (six) hours as needed for wheezing or shortness of breath. 09/09/21  Yes Levy Cedano, Kermit Balo, MD  predniSONE (DELTASONE) 10 MG tablet Take 5 tablets (50 mg total) by mouth daily with breakfast for 3 days, THEN 4 tablets (40 mg total) daily with breakfast for 2 days, THEN 2 tablets (20 mg total) daily with breakfast for 2 days, THEN 1 tablet (10 mg total) daily with breakfast for 2 days. 09/10/21 09/19/21 Yes Nadean Montanaro, Kermit Balo, MD  albuterol (PROVENTIL) (2.5 MG/3ML) 0.083% nebulizer solution Administer 2.5 mg (3 mL) via nebulizer every 6 hours as needed for wheezing or shortness of breath. 08/18/21   Charlynne Pander, MD  albuterol (VENTOLIN HFA) 108 (90 Base) MCG/ACT inhaler Inhale 2 puffs into the lungs every 4 (four) hours as needed for wheezing or shortness of breath. 06/26/21   Curatolo, Adam, DO  Ascorbic Acid (VITAMIN C) 1000 MG tablet Take 1,000 mg by  mouth daily.    [provider]  Budesonide 90 MCG/ACT inhaler Take 2 puffs twice daily. 05/31/20   Molpus, John, MD  montelukast (SINGULAIR) 10 MG tablet Take 10 mg by mouth daily. 04/03/19   [provider]  Multiple Vitamin (MULTIVITAMIN WITH MINERALS) TABS tablet Take 1 tablet by mouth daily.    [provider]  Omega-3 Fatty Acids (FISH OIL) 1000 MG CAPS Take 2,000 mg by mouth daily.    [provider]  fluticasone (FLONASE) 50 MCG/ACT nasal spray Place 2 sprays into both nostrils daily. Patient not taking: Reported on 05/24/2018 04/27/18 07/02/19  Emi Holes, PA-C  loratadine (CLARITIN) 10 MG tablet Take 1 tablet (10 mg total) by mouth daily. 04/17/19 05/31/20  Palumbo, April, MD      Allergies    Patient has no known allergies.    Review of Systems   Review of Systems  Physical Exam Updated Vital Signs BP (!) 126/53    Pulse 81    Temp 98.3 F (36.8 C)    Resp 16    Ht 6\' 6"  (1.981 m)    Wt 115.7 kg    SpO2 93%    BMI 29.47 kg/m  Physical Exam Constitutional:      General: He is not in acute distress. HENT:     Head: Normocephalic and atraumatic.  Eyes:     Conjunctiva/sclera: Conjunctivae normal.  Pupils: Pupils are equal, round, and reactive to light.  Cardiovascular:     Rate and Rhythm: Normal rate and regular rhythm.  Pulmonary:     Effort: Pulmonary effort is normal. No respiratory distress.     Breath sounds: Wheezing present.  Abdominal:     General: There is no distension.     Tenderness: There is no abdominal tenderness.  Skin:    General: Skin is warm and dry.  Neurological:     General: No focal deficit present.     Mental Status: He is alert. Mental status is at baseline.  Psychiatric:        Mood and Affect: Mood normal.        Behavior: Behavior normal.    ED Results / Procedures / Treatments   Labs (all labs ordered are listed, but only abnormal results are displayed) Labs Reviewed - No data to  display  EKG None  Radiology No results found.  Procedures Procedures    Medications Ordered in ED Medications  ipratropium-albuterol (DUONEB) 0.5-2.5 (3) MG/3ML nebulizer solution (3 mLs  Given 09/09/21 1447)  albuterol (VENTOLIN) (5 MG/ML) 0.5% continuous inhalation solution (2.5 mg  Given 09/09/21 1447)  ipratropium-albuterol (DUONEB) 0.5-2.5 (3) MG/3ML nebulizer solution 3 mL (3 mLs Nebulization Given 09/09/21 1603)  magnesium sulfate IVPB 2 g 50 mL (0 g Intravenous Stopped 09/09/21 1727)    ED Course/ Medical Decision Making/ A&P                           Medical Decision Making Risk Prescription drug management.   Wheezing, suspected asthma exacerbation No acute respiratory failure or hypoxia - comfortable on initial assessment, but significant bilateral wheezing  Will order 2nd duoneb, magnesium, IV solumedrol Would start on longer taper of prednisone, refer to pulmonology, as he is not able to establish PCP appointment for several weeks due to no available appointments  Doubt ACS, PE, PNA, sepsis, PTX.  On reassessment, patient sounds improved, wheezing better, feels better, meds prescribed, okay for discharge.        Final Clinical Impression(s) / ED Diagnoses Final diagnoses:  Exacerbation of asthma, unspecified asthma severity, unspecified whether persistent    Rx / DC Orders ED Discharge Orders          Ordered    predniSONE (DELTASONE) 10 MG tablet        09/09/21 1651    Ambulatory referral to Pulmonology       Comments: Repeat ED visits for moderate/severe asthma exacerbations   09/09/21 1600    albuterol (VENTOLIN HFA) 108 (90 Base) MCG/ACT inhaler  Every 6 hours PRN        09/09/21 1600    albuterol (PROVENTIL) (5 MG/ML) 0.5% nebulizer solution  Every 6 hours PRN        09/09/21 1600              Terald Sleeper, MD 09/10/21 [phone]

## 2021-09-09 NOTE — ED Triage Notes (Signed)
First contact with patient. Patient arrived via triage from home with complaints of shortness of breath asthma exacerbation - wheezing through chest noted. RT in triage to assess. Pt is A&OX 4. Patient speaking in full sentences.

## 2021-10-08 ENCOUNTER — Emergency Department (HOSPITAL_BASED_OUTPATIENT_CLINIC_OR_DEPARTMENT_OTHER): Payer: Self-pay

## 2021-10-08 ENCOUNTER — Emergency Department (HOSPITAL_BASED_OUTPATIENT_CLINIC_OR_DEPARTMENT_OTHER)
Admission: EM | Admit: 2021-10-08 | Discharge: 2021-10-09 | Disposition: A | Discharge status: Home / Self Care | Payer: Self-pay | Attending: Student | Admitting: Student

## 2021-10-08 ENCOUNTER — Other Ambulatory Visit: Payer: Self-pay

## 2021-10-08 ENCOUNTER — Encounter (HOSPITAL_BASED_OUTPATIENT_CLINIC_OR_DEPARTMENT_OTHER): Payer: Self-pay | Admitting: *Deleted

## 2021-10-08 DIAGNOSIS — J4541 Moderate persistent asthma with (acute) exacerbation: Secondary | ICD-10-CM

## 2021-10-08 DIAGNOSIS — Z7951 Long term (current) use of inhaled steroids: Secondary | ICD-10-CM | POA: Insufficient documentation

## 2021-10-08 DIAGNOSIS — Z87891 Personal history of nicotine dependence: Secondary | ICD-10-CM | POA: Insufficient documentation

## 2021-10-08 DIAGNOSIS — J45909 Unspecified asthma, uncomplicated: Secondary | ICD-10-CM | POA: Insufficient documentation

## 2021-10-08 LAB — CBC WITH DIFFERENTIAL/PLATELET
Abs Immature Granulocytes: 0.02 10*3/uL (ref 0.00–0.07)
Basophils Absolute: 0.1 10*3/uL (ref 0.0–0.1)
Basophils Relative: 1 %
Eosinophils Absolute: 0.2 10*3/uL (ref 0.0–0.5)
Eosinophils Relative: 3 %
HCT: 46.6 % (ref 39.0–52.0)
Hemoglobin: 15.4 g/dL (ref 13.0–17.0)
Immature Granulocytes: 0 %
Lymphocytes Relative: 35 %
Lymphs Abs: 2.3 10*3/uL (ref 0.7–4.0)
MCH: 26.3 pg (ref 26.0–34.0)
MCHC: 33 g/dL (ref 30.0–36.0)
MCV: 79.7 fL — ABNORMAL LOW (ref 80.0–100.0)
Monocytes Absolute: 0.4 10*3/uL (ref 0.1–1.0)
Monocytes Relative: 6 %
Neutro Abs: 3.8 10*3/uL (ref 1.7–7.7)
Neutrophils Relative %: 55 %
Platelets: 127 10*3/uL — ABNORMAL LOW (ref 150–400)
RBC: 5.85 MIL/uL — ABNORMAL HIGH (ref 4.22–5.81)
RDW: 12.3 % (ref 11.5–15.5)
WBC: 6.8 10*3/uL (ref 4.0–10.5)
nRBC: 0 % (ref 0.0–0.2)

## 2021-10-08 LAB — COMPREHENSIVE METABOLIC PANEL
ALT: 30 U/L (ref 0–44)
AST: 29 U/L (ref 15–41)
Albumin: 4.4 g/dL (ref 3.5–5.0)
Alkaline Phosphatase: 53 U/L (ref 38–126)
Anion gap: 7 (ref 5–15)
BUN: 10 mg/dL (ref 6–20)
CO2: 27 mmol/L (ref 22–32)
Calcium: 9.1 mg/dL (ref 8.9–10.3)
Chloride: 105 mmol/L (ref 98–111)
Creatinine, Ser: 1.04 mg/dL (ref 0.61–1.24)
GFR, Estimated: >60 mL/min (ref >60)
Glucose, Bld: 93 mg/dL (ref 70–99)
Potassium: 3.6 mmol/L (ref 3.5–5.1)
Sodium: 139 mmol/L (ref 135–145)
Total Bilirubin: 1 mg/dL (ref 0.3–1.2)
Total Protein: 7.6 g/dL (ref 6.5–8.1)

## 2021-10-08 MED ORDER — ALBUTEROL SULFATE (2.5 MG/3ML) 0.083% IN NEBU
2.5000 mg | INHALATION_SOLUTION | Freq: Once | RESPIRATORY_TRACT | Status: DC
  Filled 2021-10-08: qty 3

## 2021-10-08 MED ORDER — ALBUTEROL SULFATE (2.5 MG/3ML) 0.083% IN NEBU
10.0000 mg | INHALATION_SOLUTION | Freq: Once | RESPIRATORY_TRACT | Status: AC
  Administered 2021-10-08: 10 mg via RESPIRATORY_TRACT
  Filled 2021-10-08: qty 12

## 2021-10-08 MED ORDER — ALBUTEROL SULFATE (2.5 MG/3ML) 0.083% IN NEBU
7.5000 mg | INHALATION_SOLUTION | Freq: Once | RESPIRATORY_TRACT | Status: AC
Start: 2021-10-08 — End: 2021-10-08
  Administered 2021-10-08: 7.5 mg via RESPIRATORY_TRACT
  Filled 2021-10-08: qty 9

## 2021-10-08 MED ORDER — METHYLPREDNISOLONE SODIUM SUCC 125 MG IJ SOLR
125.0000 mg | Freq: Once | INTRAMUSCULAR | Status: AC
Start: 2021-10-08 — End: 2021-10-08
  Administered 2021-10-08: 125 mg via INTRAVENOUS
  Filled 2021-10-08: qty 2

## 2021-10-08 MED ORDER — ALBUTEROL (5 MG/ML) CONTINUOUS INHALATION SOLN: 10.0000 mg/h | INHALATION_SOLUTION | RESPIRATORY_TRACT | Status: DC

## 2021-10-08 MED ORDER — MAGNESIUM SULFATE IN D5W 1-5 GM/100ML-% IV SOLN
1.0000 g | Freq: Once | INTRAVENOUS | Status: AC
  Administered 2021-10-08: 1 g via INTRAVENOUS
  Filled 2021-10-08: qty 100

## 2021-10-08 MED ORDER — IPRATROPIUM-ALBUTEROL 0.5-2.5 (3) MG/3ML IN SOLN
3.0000 mL | Freq: Once | RESPIRATORY_TRACT | Status: DC
  Filled 2021-10-08: qty 3

## 2021-10-08 MED ORDER — IPRATROPIUM-ALBUTEROL 0.5-2.5 (3) MG/3ML IN SOLN
9.0000 mL | Freq: Once | RESPIRATORY_TRACT | Status: AC
  Administered 2021-10-08: 9 mL via RESPIRATORY_TRACT
  Filled 2021-10-08: qty 9

## 2021-10-08 NOTE — ED Triage Notes (Signed)
Pt reports increased SOB for a few days. States he has been using home nebs and inhalers frequently without relief. ?

## 2021-10-08 NOTE — ED Provider Notes (Signed)
[facility] EMERGENCY DEPARTMENT Provider Note  CSN: [id number] Arrival date & time: 10/08/21 1931  Chief Complaint(s) Shortness of Breath and Asthma  HPI Darren Finley is a 26 y.o. male with PMH asthma who presents emergency department for evaluation of shortness of breath.  Patient states that his shortness of breath acutely worsened today and he is taking his home inhalers without improvement.  He arrives with respiratory distress, accessory muscle use and subcostal retractions, shallow breathing.  Endorses cough but denies chest pain, abdominal pain, nausea, vomiting or other systemic symptoms.   Shortness of Breath Associated symptoms: cough and wheezing   Asthma Associated symptoms include shortness of breath.   Past Medical History Past Medical History:  Diagnosis Date   Asthma    There are no problems to display for this patient.  Home Medication(s) Prior to Admission medications   Medication Sig Start Date End Date Taking? Authorizing Provider  albuterol (PROVENTIL) (2.5 MG/3ML) 0.083% nebulizer solution Administer 2.5 mg (3 mL) via nebulizer every 6 hours as needed for wheezing or shortness of breath. 08/18/21   Charlynne Pander, MD  albuterol (PROVENTIL) (5 MG/ML) 0.5% nebulizer solution Take 0.5 mLs (2.5 mg total) by nebulization every 6 (six) hours as needed for wheezing or shortness of breath. 09/09/21   Terald Sleeper, MD  albuterol (VENTOLIN HFA) 108 (90 Base) MCG/ACT inhaler Inhale 2 puffs into the lungs every 4 (four) hours as needed for wheezing or shortness of breath. 06/26/21   Curatolo, Adam, DO  albuterol (VENTOLIN HFA) 108 (90 Base) MCG/ACT inhaler Inhale 2 puffs into the lungs every 6 (six) hours as needed for wheezing or shortness of breath. 09/09/21   Terald Sleeper, MD  Ascorbic Acid (VITAMIN C) 1000 MG tablet Take 1,000 mg by mouth daily.    [provider]  Budesonide 90 MCG/ACT inhaler Take 2 puffs twice daily. 05/31/20   Molpus,  John, MD  montelukast (SINGULAIR) 10 MG tablet Take 10 mg by mouth daily. 04/03/19   [provider]  Multiple Vitamin (MULTIVITAMIN WITH MINERALS) TABS tablet Take 1 tablet by mouth daily.    [provider]  Omega-3 Fatty Acids (FISH OIL) 1000 MG CAPS Take 2,000 mg by mouth daily.    [provider]  fluticasone (FLONASE) 50 MCG/ACT nasal spray Place 2 sprays into both nostrils daily. Patient not taking: Reported on 05/24/2018 04/27/18 07/02/19  Emi Holes, PA-C  loratadine (CLARITIN) 10 MG tablet Take 1 tablet (10 mg total) by mouth daily. 04/17/19 05/31/20  Palumbo, April, MD                                                                                                                                    Past Surgical History History reviewed. No pertinent surgical history. Family History No family history on file.  Social History Social History   Tobacco Use   Smoking status: Former  Types: Cigarettes   Smokeless tobacco: Never  Vaping Use   Vaping Use: Every day   Substances: Nicotine  Substance Use Topics   Alcohol use: Yes    Comment: occ   Drug use: Yes    Types: Marijuana   Allergies Patient has no known allergies.  Review of Systems Review of Systems  Respiratory:  Positive for cough, shortness of breath and wheezing.    Physical Exam Vital Signs  I have reviewed the triage vital signs BP (!) 115/51   Pulse 83   Temp 98.4 F (36.9 C) (Oral)   Resp 16   Ht 6\' 6"  (1.981 m)   Wt 113.4 kg   SpO2 94%   BMI 28.89 kg/m   Physical Exam Vitals and nursing note reviewed.  Constitutional:      General: He is in acute distress.     Appearance: He is well-developed. He is ill-appearing.  HENT:     Head: Normocephalic and atraumatic.  Eyes:     Conjunctiva/sclera: Conjunctivae normal.  Cardiovascular:     Rate and Rhythm: Normal rate and regular rhythm.     Heart sounds: No murmur heard. Pulmonary:     Effort: Tachypnea,  accessory muscle usage and respiratory distress present.     Breath sounds: Wheezing present.  Abdominal:     Palpations: Abdomen is soft.     Tenderness: There is no abdominal tenderness.  Musculoskeletal:        General: No swelling.     Cervical back: Neck supple.  Skin:    General: Skin is warm and dry.     Capillary Refill: Capillary refill takes less than 2 seconds.  Neurological:     Mental Status: He is alert.  Psychiatric:        Mood and Affect: Mood normal.    ED Results and Treatments Labs (all labs ordered are listed, but only abnormal results are displayed) Labs Reviewed  CBC WITH DIFFERENTIAL/PLATELET - Abnormal; Notable for the following components:      Result Value   RBC 5.85 (*)    MCV 79.7 (*)    Platelets 127 (*)    All other components within normal limits  COMPREHENSIVE METABOLIC PANEL                                                                                                                          Radiology DG Chest Portable 1 View  Result Date: 10/08/2021 CLINICAL DATA:  Shortness of breath EXAM: PORTABLE CHEST 1 VIEW COMPARISON:  07/29/2021 FINDINGS: Lungs are clear.  No pleural effusion or pneumothorax. Heart is normal in size. IMPRESSION: No evidence of acute cardiopulmonary disease. Electronically Signed   By: Charline Bills M.D.   On: 10/08/2021 20:16    Pertinent labs & imaging results that were available during my care of the patient were reviewed by me and considered in my medical decision making (see MDM for details).  Medications Ordered in ED Medications  ipratropium-albuterol (DUONEB) 0.5-2.5 (3) MG/3ML nebulizer solution 3 mL (3 mLs Nebulization Not Given 10/08/21 2028)  ipratropium-albuterol (DUONEB) 0.5-2.5 (3) MG/3ML nebulizer solution 9 mL (9 mLs Nebulization Given 10/08/21 2003)  methylPREDNISolone sodium succinate (SOLU-MEDROL) 125 mg/2 mL injection 125 mg (125 mg Intravenous Given 10/08/21 2015)  magnesium sulfate IVPB 1 g  100 mL (0 g Intravenous Stopped 10/08/21 2150)  albuterol (PROVENTIL) (2.5 MG/3ML) 0.083% nebulizer solution 10 mg (10 mg Nebulization Given 10/08/21 2045)  albuterol (PROVENTIL) (2.5 MG/3ML) 0.083% nebulizer solution 7.5 mg (7.5 mg Nebulization Given 10/08/21 2224)                                                                                                                                     Procedures .Critical Care Performed by: Glendora Score, MD Authorized by: Glendora Score, MD   Critical care provider statement:    Critical care time (minutes):  30   Critical care was necessary to treat or prevent imminent or life-threatening deterioration of the following conditions:  Respiratory failure   Critical care was time spent personally by me on the following activities:  Development of treatment plan with patient or surrogate, discussions with consultants, evaluation of patient's response to treatment, examination of patient, ordering and review of laboratory studies, ordering and review of radiographic studies, ordering and performing treatments and interventions, pulse oximetry, re-evaluation of patient's condition and review of old charts  (including critical care time)  Medical Decision Making / ED Course   This patient presents to the ED for concern of shortness of breath, this involves an extensive number of treatment options, and is a complaint that carries with it a high risk of complications and morbidity.  The differential diagnosis includes asthma exacerbation, pneumothorax, pneumonia, COVID-19  MDM: Patient seen emergency room for evaluation of shortness of breath.  Physical exam reveals a tachypneic patient with expiratory wheezing and shallow breathing.  Positive increased accessory muscle use.  Saturating 95% on room air.  Chest x-ray with no pneumothorax is otherwise unremarkable.  Patient given 3 DuoNebs and methylprednisolone as well as magnesium and on reevaluation,  wheezing improved but still fairly persistent.  Patient then given an additional continuous albuterol hour-long treatment and on reevaluation work of breathing improved but wheezing is persistent.  I informed the patient he should be admitted to the hospital at this point for q4 albuterol's, but he is requesting 1 additional hour-long albuterol treatment to see if he can go home afterwards.  Patient then signed out to oncoming provider.  Please see provider signout note for continuation of work-up.   Additional history obtained:  -External records from outside source obtained and reviewed including: Chart review including previous notes, labs, imaging, consultation notes   Lab Tests: -I ordered, reviewed, and interpreted labs.   The pertinent results include:   Labs Reviewed  CBC WITH DIFFERENTIAL/PLATELET - Abnormal; Notable for the following components:  Result Value   RBC 5.85 (*)    MCV 79.7 (*)    Platelets 127 (*)    All other components within normal limits  COMPREHENSIVE METABOLIC PANEL       Imaging Studies ordered: I ordered imaging studies including CXR I independently visualized and interpreted imaging. I agree with the radiologist interpretation   Medicines ordered and prescription drug management: Meds ordered this encounter  Medications   ipratropium-albuterol (DUONEB) 0.5-2.5 (3) MG/3ML nebulizer solution 3 mL   DISCONTD: albuterol (PROVENTIL) (2.5 MG/3ML) 0.083% nebulizer solution 2.5 mg   ipratropium-albuterol (DUONEB) 0.5-2.5 (3) MG/3ML nebulizer solution 9 mL   methylPREDNISolone sodium succinate (SOLU-MEDROL) 125 mg/2 mL injection 125 mg   magnesium sulfate IVPB 1 g 100 mL   DISCONTD: albuterol (PROVENTIL,VENTOLIN) solution continuous neb   albuterol (PROVENTIL) (2.5 MG/3ML) 0.083% nebulizer solution 10 mg   albuterol (PROVENTIL) (2.5 MG/3ML) 0.083% nebulizer solution 7.5 mg    -I have reviewed the patients home medicines and have made adjustments as  needed  Critical interventions Multiple DuoNebs, steroids, magnesium, continuous albuterol's   Cardiac Monitoring: The patient was maintained on a cardiac monitor.  I personally viewed and interpreted the cardiac monitored which showed an underlying rhythm of: NSR  Social Determinants of Health:  Factors impacting patients care include: none   Reevaluation: After the interventions noted above, I reevaluated the patient and found that they have :improved  Co morbidities that complicate the patient evaluation  Past Medical History:  Diagnosis Date   Asthma       Dispostion: I considered admission for this patient, and disposition will be pending reevaluation after second continuous albuterol treatment     Final Clinical Impression(s) / ED Diagnoses Final diagnoses:  None     @PCDICTATION @    Glendora Score, MD 10/08/21 [phone]

## 2021-10-08 NOTE — ED Notes (Signed)
Patient ambulated to restroom on his own; states his shortness of breath has improved. ?

## 2021-10-09 MED ORDER — PREDNISONE 10 MG PO TABS: 40.0000 mg | ORAL_TABLET | Freq: Every day | ORAL | 0 refills | Status: AC

## 2021-10-09 MED ORDER — BUDESONIDE 0.5 MG/2ML IN SUSP: 0.5000 mg | Freq: Once | RESPIRATORY_TRACT | Status: DC

## 2021-10-09 MED ORDER — FLUTICASONE PROPIONATE HFA 44 MCG/ACT IN AERO
2.0000 | INHALATION_SPRAY | Freq: Two times a day (BID) | RESPIRATORY_TRACT | Status: DC
  Administered 2021-10-09: 2 via RESPIRATORY_TRACT
  Filled 2021-10-09: qty 10.6

## 2021-10-09 MED ORDER — ALBUTEROL SULFATE (5 MG/ML) 0.5% IN NEBU: 2.5000 mg | INHALATION_SOLUTION | Freq: Four times a day (QID) | RESPIRATORY_TRACT | 12 refills | Status: DC | PRN

## 2021-10-09 MED ORDER — ALBUTEROL SULFATE HFA 108 (90 BASE) MCG/ACT IN AERS
2.0000 | INHALATION_SPRAY | Freq: Once | RESPIRATORY_TRACT | Status: AC
Start: 2021-10-09 — End: 2021-10-09
  Administered 2021-10-09: 2 via RESPIRATORY_TRACT
  Filled 2021-10-09: qty 6.7

## 2021-10-09 MED ORDER — ALBUTEROL SULFATE HFA 108 (90 BASE) MCG/ACT IN AERS: 2.0000 | INHALATION_SPRAY | RESPIRATORY_TRACT | 2 refills | Status: DC | PRN

## 2021-10-25 ENCOUNTER — Encounter: Payer: Self-pay | Admitting: Pulmonary Disease

## 2021-10-25 ENCOUNTER — Ambulatory Visit (INDEPENDENT_AMBULATORY_CARE_PROVIDER_SITE_OTHER): Payer: Self-pay | Admitting: Pulmonary Disease

## 2021-10-25 VITALS — BP 116/68 | HR 67 | Temp 97.6°F | Ht 78.0 in | Wt 263.0 lb

## 2021-10-25 DIAGNOSIS — J4540 Moderate persistent asthma, uncomplicated: Secondary | ICD-10-CM

## 2021-10-25 MED ORDER — MONTELUKAST SODIUM 10 MG PO TABS: 10.0000 mg | ORAL_TABLET | Freq: Every day | ORAL | 11 refills | Status: DC

## 2021-10-25 MED ORDER — ADVAIR HFA 115-21 MCG/ACT IN AERO: 2.0000 | INHALATION_SPRAY | Freq: Two times a day (BID) | RESPIRATORY_TRACT | 12 refills | Status: DC

## 2021-10-25 NOTE — Progress Notes (Signed)
? ?Synopsis: Referred in April 2023 for asthma by Alvester ChouMatthew Trifan, MD ? ?Subjective:  ? ?PATIENT ID: Darren GeorgeMohamed Lapier GENDER: male DOB: 1995-10-19, MRN: [id number] ? ?HPI ? ?Chief Complaint  ?Patient presents with  ? Consult  ?  Referred from hospital for history of asthma. States he was born with asthma.   ? ?Darren GeorgeMohamed Strawder is a 26 year old male, daily smoker and former vaper who is referred to pulmonary clinic for asthma.  ? ?He reports having breathing issues since he was a newborn where he had to stay in the hospital for an extra period of time after his birth.  He also notes significant asthma symptoms throughout childhood which she was requiring daily maintenance inhaler such as Advair and as needed albuterol.  In his teenage years through high school he reports no significant issues with his breathing symptoms during this time.  Around age 26 and throughout his early 6720s he has noticed increased wheezing, chest tightness, shortness of breath and cough intermittently.  He feels spring and winter times are the most difficult on his breathing and any significant changes in the weather such as cold rainy days or hot humid summer days.  He is taking Zyrtec daily for allergies.  He denies any sinus congestion or drainage. ? ?He is not currently using any maintenance inhalers.  He has had frequent urgent care and emergency room visits over recent months for his asthma symptoms. ? ?He is currently smoking 2 to 4 cigarettes/day.  He was previously vaping but quit a couple months ago.  He uses marijuana 2-3 times per week.  He lives alone and does not have any pets.  He currently works at [employer].  He reports secondhand smoke exposure in childhood from his father.  He reports a younger brother may have asthma but no other lung conditions in the family. ? ?Past Medical History:  ?Diagnosis Date  ? Asthma   ?  ? ?No family history on file.  ? ?Social History  ? ?Socioeconomic History  ? Marital status: Single  ?   Spouse name: Not on file  ? Number of children: Not on file  ? Years of education: Not on file  ? Highest education level: Not on file  ?Occupational History  ? Not on file  ?Tobacco Use  ? Smoking status: Every Day  ?  Types: Cigarettes  ? Smokeless tobacco: Never  ?Vaping Use  ? Vaping Use: Every day  ? Substances: Nicotine  ?Substance and Sexual Activity  ? Alcohol use: Yes  ?  Comment: occ  ? Drug use: Yes  ?  Types: Marijuana  ? Sexual activity: Not on file  ?Other Topics Concern  ? Not on file  ?Social History Narrative  ? Not on file  ? ?Social Determinants of Health  ? ?Financial Resource Strain: Not on file  ?Food Insecurity: Not on file  ?Transportation Needs: Not on file  ?Physical Activity: Not on file  ?Stress: Not on file  ?Social Connections: Not on file  ?Intimate Partner Violence: Not on file  ?  ? ?No Known Allergies  ? ?Outpatient Medications Prior to Visit  ?Medication Sig Dispense Refill  ? albuterol (PROVENTIL) (5 MG/ML) 0.5% nebulizer solution Take 0.5 mLs (2.5 mg total) by nebulization every 6 (six) hours as needed for wheezing or shortness of breath. 20 mL 12  ? albuterol (VENTOLIN HFA) 108 (90 Base) MCG/ACT inhaler Inhale 2 puffs into the lungs every 4 (four) hours as needed for wheezing  or shortness of breath. 1 each 2  ? cetirizine (ZYRTEC) 10 MG tablet Take 10 mg by mouth daily.    ? Budesonide 90 MCG/ACT inhaler Take 2 puffs twice daily. 1 each 0  ? Ascorbic Acid (VITAMIN C) 1000 MG tablet Take 1,000 mg by mouth daily.    ? montelukast (SINGULAIR) 10 MG tablet Take 10 mg by mouth daily.    ? Multiple Vitamin (MULTIVITAMIN WITH MINERALS) TABS tablet Take 1 tablet by mouth daily.    ? Omega-3 Fatty Acids (FISH OIL) 1000 MG CAPS Take 2,000 mg by mouth daily.    ? ?No facility-administered medications prior to visit.  ? ? ?Review of Systems  ?Constitutional:  Negative for chills, fever, malaise/fatigue and weight loss.  ?HENT:  Negative for congestion, sinus pain and sore throat.   ?Eyes:  Negative.   ?Respiratory:  Positive for shortness of breath and wheezing. Negative for cough, hemoptysis and sputum production.   ?Cardiovascular:  Negative for chest pain, palpitations, orthopnea, claudication and leg swelling.  ?Gastrointestinal:  Negative for abdominal pain, heartburn, nausea and vomiting.  ?Genitourinary: Negative.   ?Musculoskeletal:  Negative for joint pain and myalgias.  ?Skin:  Negative for rash.  ?Neurological:  Negative for weakness.  ?Endo/Heme/Allergies:  Positive for environmental allergies.  ?Psychiatric/Behavioral: Negative.    ? ?Objective:  ? ?Vitals:  ? 10/25/21 1054  ?BP: 116/68  ?Pulse: 67  ?Temp: 97.6 ?F (36.4 ?C)  ?TempSrc: Oral  ?SpO2: 98%  ?Weight: 263 lb (119.3 kg)  ?Height: 6\' 6"  (1.981 m)  ? ?Physical Exam ?Constitutional:   ?   General: He is not in acute distress. ?HENT:  ?   Head: Normocephalic and atraumatic.  ?Eyes:  ?   Extraocular Movements: Extraocular movements intact.  ?   Conjunctiva/sclera: Conjunctivae normal.  ?   Pupils: Pupils are equal, round, and reactive to light.  ?Cardiovascular:  ?   Rate and Rhythm: Normal rate and regular rhythm.  ?   Pulses: Normal pulses.  ?   Heart sounds: Normal heart sounds. No murmur heard. ?Pulmonary:  ?   Breath sounds: Wheezing (mild, anteriorly) present.  ?Abdominal:  ?   General: Bowel sounds are normal.  ?   Palpations: Abdomen is soft.  ?Musculoskeletal:  ?   Right lower leg: No edema.  ?   Left lower leg: No edema.  ?Lymphadenopathy:  ?   Cervical: No cervical adenopathy.  ?Skin: ?   General: Skin is warm and dry.  ?Neurological:  ?   General: No focal deficit present.  ?   Mental Status: He is alert.  ?Psychiatric:     ?   Mood and Affect: Mood normal.     ?   Behavior: Behavior normal.     ?   Thought Content: Thought content normal.     ?   Judgment: Judgment normal.  ? ?CBC ?   ?Component Value Date/Time  ? WBC 6.8 10/08/2021 2000  ? RBC 5.85 (H) 10/08/2021 2000  ? HGB 15.4 10/08/2021 2000  ? HCT 46.6 10/08/2021  2000  ? PLT 127 (L) 10/08/2021 2000  ? MCV 79.7 (L) 10/08/2021 2000  ? MCH 26.3 10/08/2021 2000  ? MCHC 33.0 10/08/2021 2000  ? RDW 12.3 10/08/2021 2000  ? LYMPHSABS 2.3 10/08/2021 2000  ? MONOABS 0.4 10/08/2021 2000  ? EOSABS 0.2 10/08/2021 2000  ? BASOSABS 0.1 10/08/2021 2000  ? ? ?  Latest Ref Rng & Units 10/08/2021  ?  8:00 PM 08/18/2021  ?  8:30 PM 04/27/2018  ?  2:42 PM  ?BMP  ?Glucose 70 - 99 mg/dL 93   94   93    ?BUN 6 - 20 mg/dL 10   13   9     ?Creatinine 0.61 - 1.24 mg/dL   2.44   0.10    ?Sodium 135 - 145 mmol/L 139   139   141    ?Potassium 3.5 - 5.1 mmol/L 3.6   3.6   3.5    ?Chloride 98 - 111 mmol/L 105   104   107    ?CO2 22 - 32 mmol/L 27   25   23     ?Calcium 8.9 - 10.3 mg/dL 9.1   9.3   9.4    ? ?Chest imaging: ?CXR 10/08/21 ?Lungs are clear.  No pleural effusion or pneumothorax. ?  ?Heart is normal in size. ? ?PFT: ?   ? View : No data to display.  ?  ?  ?  ? ?Labs: ? ?Path: ? ?Echo: ? ?Heart Catheterization: ? ?Assessment & Plan:  ? ?Moderate persistent asthma without complication - Plan: Pulmonary Function Test, montelukast (SINGULAIR) 10 MG tablet, fluticasone-salmeterol (ADVAIR HFA) 115-21 MCG/ACT inhaler ? ?Discussion: ?Cache Bills is a 26 year old male, daily smoker and former vaper who is referred to pulmonary clinic for asthma.  ? ?His clinical history is concerning for moderate persistent asthma and we discussed the need for a daily maintenance inhaler.  I have sent in Advair 115-21 mcg 2 puffs twice daily and he can continue as needed albuterol inhaler.  Given his significant allergic component to his asthma symptoms I have also sent in a prescription for montelukast 10 mg daily. ? ?We discussed smoking cessation with the use of nicotine replacement such as nicotine gum or lozenges.  I discussed that ongoing smoking of cigarettes and marijuana can lead to ongoing inflammation and irritation of his asthma symptoms. ? ?He is to follow-up in 4 months with pulmonary function  testing. ? ?Darren George, MD ?[facility] ?Office: [phone] ? ? ?Current Outpatient Medications:  ?  albuterol (PROVENTIL) (5 MG/ML) 0.5% nebulizer solution, Take 0.5 mLs (2.5 mg total) by

## 2021-10-25 NOTE — Patient Instructions (Addendum)
You have asthma that requires a daily maintenance inhaler to prevent further exacerbations of your breathing.  ? ?Based on your insurance formulary, advair HFA inhaler 2 puffs twice daily appears to be covered. ?- Start this inhaler and rinse mouth out after each use ? ?Start montelukast 10mg  daily ? ?Continue zyrtec daily ? ?Continue to use albuterol inhaler 1-2 puffs every 4-6 hours as needed.  ? ?Follow up in 4 months with pulmonary function tests ? ? ?

## 2021-10-26 ENCOUNTER — Other Ambulatory Visit (HOSPITAL_COMMUNITY): Payer: Self-pay

## 2021-10-26 ENCOUNTER — Telehealth: Payer: Self-pay

## 2021-10-26 NOTE — Telephone Encounter (Signed)
Patient Advocate Encounter ?  ?Received notification from [facility] that prior authorization for Advair HFA is required by his/her insurance [place] [employer]. ? ?[place] Tracks#: [id number] W ?  ?PA submitted on 10/26/21 ? ?Status is pending ?   ?[facility] will continue to follow: ? ?Patient Advocate ?Fax: [phone]  ?

## 2021-11-01 ENCOUNTER — Other Ambulatory Visit (HOSPITAL_COMMUNITY): Payer: Self-pay

## 2021-11-01 NOTE — Telephone Encounter (Signed)
Patient Advocate Encounter ? ?Received notification from [facility] that the request for prior authorization for Advair HFA has been denied due to the patient only having family planning coverage thru [place] [employer].   ?  ? ?[facility] Patient Advocate ?Fax: [phone]  ?

## 2021-11-03 NOTE — Telephone Encounter (Signed)
Called and spoke with patient. I made him aware of the insurance situation. He stated that he has been in contact with his case worker and she stated that he is not eligible to switch plans since he does not have a disability and is not over the age of 66.  ? ?I explained to him that we could try to get him setup for patient assistance. He would like to proceed with this. He is aware that I will mail a copy of the application to him to complete. He will mail it back to the office.  ? ?Application has placed in the mail.  ? ?Nothing further needed at time of call.  ?

## 2021-11-14 NOTE — Telephone Encounter (Addendum)
Patient assistance forms were returned to sender- address on file is different than what was mailed. Left voicemail for patient to call back and confirm address. Mailing to [address] [place] [address] as this is on his chart and what is on his drivers licence. ?

## 2021-12-08 ENCOUNTER — Telehealth: Payer: Self-pay | Admitting: Pulmonary Disease

## 2021-12-08 MED ORDER — ALBUTEROL SULFATE HFA 108 (90 BASE) MCG/ACT IN AERS: 2.0000 | INHALATION_SPRAY | RESPIRATORY_TRACT | 2 refills | Status: DC | PRN

## 2021-12-08 NOTE — Telephone Encounter (Signed)
Called and spoke with patient. Patient verified pharmacy. Sent in albuterol inhaler. Advised patient that there was a shortage on the nebulizer solutions.  Nothing further needed.

## 2021-12-20 ENCOUNTER — Telehealth: Payer: Self-pay | Admitting: Pulmonary Disease

## 2021-12-21 NOTE — Telephone Encounter (Signed)
Called patient and he informed us that his pharmacy does not carry:  Albuterol 0.5% neb solution but they do carry the albuterol 0.083% neb solution.  Are you ok with this change Dr Francine Graven?  And patient was asking for a short term course of prednisone because he states the singular is not helping with his allergies at the moment due to the weather change?  Please advise Dr Francine Graven

## 2021-12-22 MED ORDER — ALBUTEROL SULFATE (2.5 MG/3ML) 0.083% IN NEBU: 2.5000 mg | INHALATION_SOLUTION | Freq: Four times a day (QID) | RESPIRATORY_TRACT | 12 refills | Status: DC | PRN

## 2021-12-22 MED ORDER — PREDNISONE 10 MG PO TABS: 40.0000 mg | ORAL_TABLET | Freq: Every day | ORAL | 0 refills | Status: AC

## 2021-12-22 NOTE — Telephone Encounter (Signed)
Patient checking on message for medication. Patient phone number is [phone].

## 2021-12-22 NOTE — Telephone Encounter (Signed)
Ok for albuterol dosage change.   Ok for prednisone 40mg  daily x 5 days   JD  I called the patient and gave him the recommendation. He did not have any questions. Nothing further needed.

## 2021-12-26 ENCOUNTER — Telehealth: Payer: Self-pay | Admitting: Pulmonary Disease

## 2021-12-27 NOTE — Telephone Encounter (Signed)
Spoke with pt and explained that since he was not seen in office or via televisit that we could not provide work note for him. Pt stated understanding. Nothing further needed at this this time.

## 2021-12-27 NOTE — Telephone Encounter (Signed)
I called the patient and he reports he was out of work for a Asthma flare up that caused increase shortness of breath from 12/22/21 until 12/23/2021. Last OV was 10/25/21. Please advise if ok to write a doctors note for that time.

## 2021-12-30 ENCOUNTER — Telehealth: Payer: Self-pay | Admitting: Pulmonary Disease

## 2021-12-30 MED ORDER — PREDNISONE 10 MG PO TABS: ORAL_TABLET | ORAL | 0 refills | Status: AC

## 2021-12-30 NOTE — Telephone Encounter (Signed)
Bevelyn Ngo, NP  Carleene Mains D, CMA; [facility] Triage Pool 4 minutes ago (4:48 PM)    OK to send her in a taper  Prednisone taper; 10 mg tablets: 4 tabs x 2 days, 3 tabs x 2 days, 2 tabs x 2 days 1 tab x 2 days then stop.  She must have follow up with Dr. Francine Graven or APP next week to have this better evaluated. Tell her if she has any worsening to seek emergency care.      Called and spoke with pt letting him know recs per SG and he verbalized understanding. Rx has been sent to pharmacy for pt and pt has also been scheduled an appt. Nothing further needed.

## 2021-12-30 NOTE — Telephone Encounter (Signed)
Pt requesting prednisone refilled. Air quality has affected his asthma. [facility] [address].   Looks like he was recently given 40 mg prednisone for 5 days.   ATC patient. LMTCB   Dr. Francine Graven is out of office will sent to DOD. Please advise Kandice Robinsons NP. Thanks!

## 2022-01-06 ENCOUNTER — Ambulatory Visit: Payer: Medicaid Other | Admitting: Nurse Practitioner

## 2022-01-12 ENCOUNTER — Other Ambulatory Visit: Payer: Self-pay | Admitting: Pulmonary Disease

## 2022-01-12 ENCOUNTER — Telehealth: Payer: Self-pay | Admitting: Pulmonary Disease

## 2022-01-13 NOTE — Telephone Encounter (Signed)
Called and spoke with patient, he states that the rainy and hot weather has caused a flare up of his asthma/allergies, complains of:  congestion, wheezing and chest tightness.  He is requesting a refill of prednisone.  Advised that he has had 2 tapers of prednisone and he will need to be evaluated in the office.  I asked if the inhalers help and he stated they do, but the prednisone clears out the congestion better.  Scheduled patient to see Micheline Maze NP on 01/18/2022 at 9:30 am, advised to arrive by 9:15 am for check in .  He verbalized understanding.  Nothing further needed.

## 2022-01-18 ENCOUNTER — Ambulatory Visit: Payer: Medicaid Other | Admitting: Nurse Practitioner

## 2022-02-11 ENCOUNTER — Other Ambulatory Visit: Payer: Self-pay | Admitting: Pulmonary Disease

## 2022-02-13 ENCOUNTER — Telehealth: Payer: Self-pay | Admitting: Pulmonary Disease

## 2022-02-13 MED ORDER — ALBUTEROL SULFATE HFA 108 (90 BASE) MCG/ACT IN AERS: INHALATION_SPRAY | RESPIRATORY_TRACT | 1 refills | Status: DC

## 2022-02-13 NOTE — Telephone Encounter (Signed)
Called and spoke with patient. Patient stated that he needed a refill on his albuterol inhaler. Patient verified his pharmacy. Refill was sent.   Patient also stated he wanted to know if he could get another script for prednisone to take because the air quality has been flaring his asthma up.   JD, please advise.

## 2022-02-13 NOTE — Telephone Encounter (Signed)
ATC patient. LVMTCB. 

## 2022-02-14 MED ORDER — PREDNISONE 10 MG PO TABS: 40.0000 mg | ORAL_TABLET | Freq: Every day | ORAL | 0 refills | Status: AC

## 2022-02-14 NOTE — Telephone Encounter (Signed)
Thank you for sending in albuterol refill. Ok to send in 40mg  prednisone x 5 days. Please ensure patient has quit smoking as this will only continue to flare his asthma as we discussed at last visit.  JD

## 2022-02-14 NOTE — Telephone Encounter (Signed)
Called and left voicemail that medication was getting sent in. And how to take medication. Nothing further needed

## 2022-03-17 ENCOUNTER — Telehealth: Payer: Self-pay | Admitting: Pulmonary Disease

## 2022-03-17 NOTE — Telephone Encounter (Signed)
Called and spoke with patient. He verified pharmacy. Patient stated that he needed a refill on prednisone.   JD, please advise.

## 2022-03-20 NOTE — Telephone Encounter (Signed)
Patient needs to have follow up visit and PFTs scheduled.  You can send in prednisone refill, but this is last one until he is seen again in clinic. He needs his inhaler regimen optimized as he has poorly controlled asthma.  He needs to quit smoking if he has not.   Thanks, JD

## 2022-03-20 NOTE — Telephone Encounter (Signed)
Pt calling back [phone]

## 2022-03-21 MED ORDER — PREDNISONE 20 MG PO TABS: 40.0000 mg | ORAL_TABLET | Freq: Every day | ORAL | 0 refills | Status: DC

## 2022-03-21 NOTE — Telephone Encounter (Signed)
Pt is scheduled for PFT and ov in Oct 2023  Pred refilled and I left him detailed msg to please keep appts  I advised that this will not be filled again until seen in clinic

## 2022-04-12 ENCOUNTER — Ambulatory Visit
Admission: EM | Admit: 2022-04-12 | Discharge: 2022-04-12 | Disposition: A | Discharge status: Home / Self Care | Payer: Commercial Managed Care - HMO | Attending: Urgent Care | Admitting: Urgent Care

## 2022-04-12 DIAGNOSIS — R062 Wheezing: Secondary | ICD-10-CM

## 2022-04-12 DIAGNOSIS — J309 Allergic rhinitis, unspecified: Secondary | ICD-10-CM | POA: Diagnosis not present

## 2022-04-12 DIAGNOSIS — J4531 Mild persistent asthma with (acute) exacerbation: Secondary | ICD-10-CM

## 2022-04-12 DIAGNOSIS — R0981 Nasal congestion: Secondary | ICD-10-CM

## 2022-04-12 DIAGNOSIS — R0789 Other chest pain: Secondary | ICD-10-CM

## 2022-04-12 MED ORDER — ALBUTEROL SULFATE HFA 108 (90 BASE) MCG/ACT IN AERS: INHALATION_SPRAY | RESPIRATORY_TRACT | 5 refills | Status: DC

## 2022-04-12 MED ORDER — TRIAMCINOLONE ACETONIDE 40 MG/ML IJ SUSP
40.0000 mg | Freq: Once | INTRAMUSCULAR | Status: AC
  Administered 2022-04-12: 40 mg via INTRAMUSCULAR

## 2022-04-12 MED ORDER — PREDNISONE 50 MG PO TABS: 50.0000 mg | ORAL_TABLET | Freq: Every day | ORAL | 0 refills | Status: DC

## 2022-04-12 NOTE — ED Triage Notes (Signed)
Asthma/allergy check up. Sinus issues, wheezing, chest tightness, and sneezing for 4 days   Home interventions Zyretec, albuterol inhaler, nebs

## 2022-04-12 NOTE — ED Provider Notes (Signed)
[name]  Note:  This document was prepared using Systems analyst and may include unintentional dictation errors.  MRN: [id number] DOB: 1996-02-22  Subjective:   Darren Finley is a 26 y.o. male presenting for 4 day history of wheezing, sinus congestion, chest tightness, sneezing.  Patient has a difficult time with his allergies and his asthma at this time of year.  Usually has to do multiple rounds of steroids.  He has been using his nebulized albuterol.  This does give him temporary relief.  Needs a refill on his albuterol inhaler.  He is also a smoker, has vaping.  Does not feel sick symptoms.  Does not want to be COVID tested.  No current facility-administered medications for this encounter.  Current Outpatient Medications:    albuterol (PROVENTIL) (2.5 MG/3ML) 0.083% nebulizer solution, Take 3 mLs (2.5 mg total) by nebulization every 6 (six) hours as needed for wheezing or shortness of breath., Disp: 75 mL, Rfl: 12   albuterol (VENTOLIN HFA) 108 (90 Base) MCG/ACT inhaler, INHALE 2 PUFFS BY MOUTH EVERY 4 HOURS AS NEEDED FOR WHEEZING OR SHORTNESS OF BREATH, Disp: 8.5 g, Rfl: 1   cetirizine (ZYRTEC) 10 MG tablet, Take 10 mg by mouth daily., Disp: , Rfl:    No Known Allergies  Past Medical History:  Diagnosis Date   Asthma      No past surgical history on file.  No family history on file.  Social History   Tobacco Use   Smoking status: Every Day    Types: Cigarettes   Smokeless tobacco: Never  Vaping Use   Vaping Use: Every day   Substances: Nicotine  Substance Use Topics   Alcohol use: Yes    Comment: occ   Drug use: Yes    Types: Marijuana    ROS   Objective:   Vitals: BP 118/68 (BP Location: Right Arm)   Pulse 74   Temp 98.4 F (36.9 C)   Resp 16   SpO2 97%   Physical Exam Constitutional:      General: He is not in acute distress.    Appearance: Normal appearance. He is well-developed. He is not  ill-appearing, toxic-appearing or diaphoretic.  HENT:     Head: Normocephalic and atraumatic.     Right Ear: External ear normal.     Left Ear: External ear normal.     Nose: Nose normal.     Mouth/Throat:     Mouth: Mucous membranes are moist.  Eyes:     General: No scleral icterus.       Right eye: No discharge.        Left eye: No discharge.     Extraocular Movements: Extraocular movements intact.  Cardiovascular:     Rate and Rhythm: Normal rate and regular rhythm.     Heart sounds: Normal heart sounds. No murmur heard.    No friction rub. No gallop.  Pulmonary:     Effort: Pulmonary effort is normal. No respiratory distress.     Breath sounds: No stridor. Wheezing (throughout) present. No rhonchi or rales.  Neurological:     Mental Status: He is alert and oriented to person, place, and time.  Psychiatric:        Mood and Affect: Mood normal.        Behavior: Behavior normal.        Thought Content: Thought content normal.    Assessment and Plan :   PDMP not reviewed this encounter.  1. Mild persistent asthma with allergic rhinitis with acute exacerbation   2. Wheezing   3. Allergic rhinitis, unspecified seasonality, unspecified trigger   4. Sinus congestion   5. Chest tightness    IM triamcinolone 40 mg.  Follow this with an oral prednisone course.  I refilled his albuterol inhaler.  Recommended he continue his allergy medications.  Patient declined a COVID test.  Deferred imaging for now. Counseled patient on potential for adverse effects with medications prescribed/recommended today, ER and return-to-clinic precautions discussed, patient verbalized understanding.    Wallis Bamberg, [place] 04/12/22 [phone]

## 2022-04-23 ENCOUNTER — Ambulatory Visit
Admission: EM | Admit: 2022-04-23 | Discharge: 2022-04-23 | Disposition: A | Discharge status: Home / Self Care | Payer: Commercial Managed Care - HMO | Attending: Urgent Care | Admitting: Urgent Care

## 2022-04-23 ENCOUNTER — Encounter: Payer: Self-pay | Admitting: Emergency Medicine

## 2022-04-23 DIAGNOSIS — J4540 Moderate persistent asthma, uncomplicated: Secondary | ICD-10-CM

## 2022-04-23 DIAGNOSIS — R062 Wheezing: Secondary | ICD-10-CM

## 2022-04-23 MED ORDER — PREDNISONE 50 MG PO TABS: 50.0000 mg | ORAL_TABLET | Freq: Every day | ORAL | 0 refills | Status: DC

## 2022-04-23 MED ORDER — FLUTICASONE FUROATE-VILANTEROL 100-25 MCG/ACT IN AEPB: 1.0000 | INHALATION_SPRAY | Freq: Every day | RESPIRATORY_TRACT | 3 refills | Status: DC

## 2022-04-23 MED ORDER — FLUTICASONE FUROATE-VILANTEROL 200-25 MCG/ACT IN AEPB: 1.0000 | INHALATION_SPRAY | Freq: Every day | RESPIRATORY_TRACT | 3 refills | Status: DC

## 2022-04-23 MED ORDER — ALBUTEROL SULFATE (2.5 MG/3ML) 0.083% IN NEBU: 2.5000 mg | INHALATION_SOLUTION | Freq: Four times a day (QID) | RESPIRATORY_TRACT | 0 refills | Status: DC | PRN

## 2022-04-23 NOTE — Discharge Instructions (Addendum)
I have placed orders to have an x-ray done at the med center in [place].  Please had there now.  I will call you with your results and update our treatment plan if necessary after I get the report.  Please wait to go pick up your prescriptions for any medications I have prescribed for you until after we discussed your x-ray results. 

## 2022-04-23 NOTE — ED Triage Notes (Signed)
Recently seen for asthma flare, it has persisted into today. States he's been having to use his albuterol inhaler non-stop to keep the wheezing/chest tightness at bay. States he was using montelukast, taking 2 10 mg tabs daily, but states he didn't notice a difference in his asthma flares so he stopped taking it around 2-3 weeks ago. Sees a pulmonologist, but hasn't been in a while. Only using albuterol to manage asthma at this time.

## 2022-04-23 NOTE — ED Provider Notes (Signed)
[name]  Note:  This document was prepared using Conservation officer, historic buildings and may include unintentional dictation errors.  MRN: [id number] DOB: 08-28-1995  Subjective:   Darren Finley is a 26 y.o. male presenting for recheck on persistent asthma symptoms.  Patient was last seen 04/12/2022.  He underwent a steroid injection with triamcinolone 40 mg IM.  He was also provided with a prescription for prednisone at 50 mg for 5 days.  Patient reports that this is a very difficult time for him and usually requires multiple rounds of steroids.  Patient is a smoker, vapes daily and uses marijuana occasionally.  He does have a pulmonologist that he is supposed to see but has not followed up with them.  He has previously been prescribed an inhaled steroids but reports that he was not able to afford them.  He is not opposed to getting a chest x-ray to rule out a secondary pneumonia.  No current facility-administered medications for this encounter.  Current Outpatient Medications:    albuterol (PROVENTIL) (2.5 MG/3ML) 0.083% nebulizer solution, Take 3 mLs (2.5 mg total) by nebulization every 6 (six) hours as needed for wheezing or shortness of breath., Disp: 75 mL, Rfl: 12   albuterol (VENTOLIN HFA) 108 (90 Base) MCG/ACT inhaler, INHALE 2 PUFFS BY MOUTH EVERY 4 HOURS AS NEEDED FOR WHEEZING OR SHORTNESS OF BREATH, Disp: 18 g, Rfl: 5   cetirizine (ZYRTEC) 10 MG tablet, Take 10 mg by mouth daily., Disp: , Rfl:    predniSONE (DELTASONE) 50 MG tablet, Take 1 tablet (50 mg total) by mouth daily with breakfast., Disp: 5 tablet, Rfl: 0   No Known Allergies  Past Medical History:  Diagnosis Date   Asthma      History reviewed. No pertinent surgical history.  History reviewed. No pertinent family history.  Social History   Tobacco Use   Smoking status: Every Day    Types: Cigarettes   Smokeless tobacco: Never  Vaping Use   Vaping Use: Every day   Substances:  Nicotine  Substance Use Topics   Alcohol use: Yes    Comment: occ   Drug use: Yes    Types: Marijuana    ROS   Objective:   Vitals: BP 116/87 (BP Location: Left Arm)   Pulse 83   Temp 98.1 F (36.7 C) (Oral)   Resp 16   SpO2 93%   Physical Exam Constitutional:      General: He is not in acute distress.    Appearance: Normal appearance. He is well-developed. He is not ill-appearing, toxic-appearing or diaphoretic.  HENT:     Head: Normocephalic and atraumatic.     Right Ear: External ear normal.     Left Ear: External ear normal.     Nose: Nose normal.     Mouth/Throat:     Mouth: Mucous membranes are moist.  Eyes:     General: No scleral icterus.       Right eye: No discharge.        Left eye: No discharge.     Extraocular Movements: Extraocular movements intact.  Cardiovascular:     Rate and Rhythm: Normal rate and regular rhythm.     Heart sounds: Normal heart sounds. No murmur heard.    No friction rub. No gallop.  Pulmonary:     Effort: Pulmonary effort is normal. No respiratory distress.     Breath sounds: No stridor. Wheezing (throughout) present. No rhonchi or rales.  Neurological:  Mental Status: He is alert and oriented to person, place, and time.  Psychiatric:        Mood and Affect: Mood normal.        Behavior: Behavior normal.        Thought Content: Thought content normal.     Assessment and Plan :   PDMP not reviewed this encounter.  1. Moderate persistent asthma without complication   2. Wheezing     We will be pursuing an outpatient x-ray.  Recommended patient do 1 more round of oral prednisone.  I did search for different types of inhalers that would potentially be covered by his insurance and landed on [place].  I refilled his nebulized albuterol.  Recommended he follow-up with his pulmonologist as his daily maintenance inhaler medications may require prior authorization.  I informed him that they would have to be completed by  the pulmonologist. Counseled patient on potential for adverse effects with medications prescribed/recommended today, ER and return-to-clinic precautions discussed, patient verbalized understanding.    Jaynee Eagles, [place] 04/23/22 [phone]

## 2022-05-16 ENCOUNTER — Ambulatory Visit: Payer: Medicaid Other | Admitting: Pulmonary Disease

## 2022-05-25 ENCOUNTER — Encounter: Payer: Self-pay | Admitting: Emergency Medicine

## 2022-05-25 ENCOUNTER — Ambulatory Visit
Admission: EM | Admit: 2022-05-25 | Discharge: 2022-05-25 | Disposition: A | Discharge status: Home / Self Care | Payer: Commercial Managed Care - HMO | Attending: Urgent Care | Admitting: Urgent Care

## 2022-05-25 DIAGNOSIS — J4540 Moderate persistent asthma, uncomplicated: Secondary | ICD-10-CM | POA: Diagnosis not present

## 2022-05-25 DIAGNOSIS — J309 Allergic rhinitis, unspecified: Secondary | ICD-10-CM

## 2022-05-25 DIAGNOSIS — J019 Acute sinusitis, unspecified: Secondary | ICD-10-CM | POA: Diagnosis not present

## 2022-05-25 MED ORDER — AMOXICILLIN-POT CLAVULANATE 875-125 MG PO TABS: 1.0000 | ORAL_TABLET | Freq: Two times a day (BID) | ORAL | 0 refills | Status: DC

## 2022-05-25 MED ORDER — PSEUDOEPHEDRINE HCL 60 MG PO TABS: 60.0000 mg | ORAL_TABLET | Freq: Three times a day (TID) | ORAL | 0 refills | Status: DC | PRN

## 2022-05-25 NOTE — ED Provider Notes (Signed)
[name]  Note:  This document was prepared using Systems analyst and may include unintentional dictation errors.  MRN: [id number] DOB: 07/06/1996  Subjective:   Dajour Pierpoint is a 26 y.o. male presenting for 1 week history of persistent sinus congestion, sinus pain and pressure.  Has also had sinus headaches, postnasal drainage.  I have previously seen patient for his asthma and he is much better controlled after we started Adair Patter for him.  Does not feel like he is wheezing or having chest congestion at this time.  He is taking Zyrtec daily.  No current facility-administered medications for this encounter.  Current Outpatient Medications:    albuterol (PROVENTIL) (2.5 MG/3ML) 0.083% nebulizer solution, Take 3 mLs (2.5 mg total) by nebulization every 6 (six) hours as needed for wheezing or shortness of breath., Disp: 75 mL, Rfl: 0   albuterol (VENTOLIN HFA) 108 (90 Base) MCG/ACT inhaler, INHALE 2 PUFFS BY MOUTH EVERY 4 HOURS AS NEEDED FOR WHEEZING OR SHORTNESS OF BREATH, Disp: 18 g, Rfl: 5   cetirizine (ZYRTEC) 10 MG tablet, Take 10 mg by mouth daily., Disp: , Rfl:    fluticasone furoate-vilanterol (BREO ELLIPTA) 100-25 MCG/ACT AEPB, Inhale 1 puff into the lungs daily., Disp: 60 each, Rfl: 3   predniSONE (DELTASONE) 50 MG tablet, Take 1 tablet (50 mg total) by mouth daily with breakfast., Disp: 5 tablet, Rfl: 0   No Known Allergies  Past Medical History:  Diagnosis Date   Asthma      History reviewed. No pertinent surgical history.  No family history on file.  Social History   Tobacco Use   Smoking status: Every Day    Types: Cigarettes   Smokeless tobacco: Never  Vaping Use   Vaping Use: Every day   Substances: Nicotine  Substance Use Topics   Alcohol use: Yes    Comment: occ   Drug use: Yes    Types: Marijuana    ROS   Objective:   Vitals: BP 124/78   Pulse 71   Temp 98.1 F (36.7 C)   Resp 15   SpO2 96%    Physical Exam Constitutional:      General: He is not in acute distress.    Appearance: Normal appearance. He is well-developed and normal weight. He is not ill-appearing, toxic-appearing or diaphoretic.  HENT:     Head: Normocephalic and atraumatic.     Right Ear: Tympanic membrane, ear canal and external ear normal. No drainage, swelling or tenderness. No middle ear effusion. There is no impacted cerumen. Tympanic membrane is not erythematous or bulging.     Left Ear: Tympanic membrane, ear canal and external ear normal. No drainage, swelling or tenderness.  No middle ear effusion. There is no impacted cerumen. Tympanic membrane is not erythematous or bulging.     Nose: Congestion present. No rhinorrhea.     Mouth/Throat:     Mouth: Mucous membranes are moist.     Pharynx: No oropharyngeal exudate or posterior oropharyngeal erythema.  Eyes:     General: No scleral icterus.       Right eye: No discharge.        Left eye: No discharge.     Extraocular Movements: Extraocular movements intact.     Conjunctiva/sclera: Conjunctivae normal.  Cardiovascular:     Rate and Rhythm: Normal rate and regular rhythm.     Heart sounds: Normal heart sounds. No murmur heard.    No friction rub. No gallop.  Pulmonary:     Effort: Pulmonary effort is normal. No respiratory distress.     Breath sounds: Normal breath sounds. No stridor. No wheezing, rhonchi or rales.  Musculoskeletal:     Cervical back: Normal range of motion and neck supple. No rigidity. No muscular tenderness.  Neurological:     General: No focal deficit present.     Mental Status: He is alert and oriented to person, place, and time.  Psychiatric:        Mood and Affect: Mood normal.        Behavior: Behavior normal.        Thought Content: Thought content normal.       Assessment and Plan :   PDMP not reviewed this encounter.  1. Acute sinusitis, recurrence not specified, unspecified location   2. Allergic rhinitis,  unspecified seasonality, unspecified trigger   3. Moderate persistent asthma without complication     Will start empiric treatment for sinusitis with Augmentin.  Recommended supportive care otherwise including the use of oral antihistamine, decongestant.  Maintain asthma medications.  Counseled patient on potential for adverse effects with medications prescribed/recommended today, ER and return-to-clinic precautions discussed, patient verbalized understanding.    Wallis Bamberg, PA-C 05/25/22 1228

## 2022-05-25 NOTE — ED Triage Notes (Signed)
Pt reports for a week having sinus congestion and sinus headache. Dayquil and nyquil without relief. Had negative covid test.

## 2022-06-08 ENCOUNTER — Ambulatory Visit: Payer: Medicaid Other | Admitting: Pulmonary Disease

## 2022-06-12 ENCOUNTER — Ambulatory Visit
Admission: EM | Admit: 2022-06-12 | Discharge: 2022-06-12 | Disposition: A | Discharge status: Home / Self Care | Payer: Commercial Managed Care - HMO | Attending: Urgent Care | Admitting: Urgent Care

## 2022-06-12 DIAGNOSIS — J4540 Moderate persistent asthma, uncomplicated: Secondary | ICD-10-CM | POA: Diagnosis not present

## 2022-06-12 DIAGNOSIS — R062 Wheezing: Secondary | ICD-10-CM

## 2022-06-12 MED ORDER — PREDNISONE 20 MG PO TABS: 20.0000 mg | ORAL_TABLET | Freq: Every day | ORAL | 0 refills | Status: DC

## 2022-06-12 MED ORDER — PREDNISONE 50 MG PO TABS: 50.0000 mg | ORAL_TABLET | Freq: Every day | ORAL | 0 refills | Status: DC

## 2022-06-12 MED ORDER — FLUTICASONE FUROATE-VILANTEROL 200-25 MCG/ACT IN AEPB: 1.0000 | INHALATION_SPRAY | Freq: Every day | RESPIRATORY_TRACT | 3 refills | Status: DC

## 2022-06-12 NOTE — ED Provider Notes (Signed)
[name]  Note:  This document was prepared using Conservation officer, historic buildings and may include unintentional dictation errors.  MRN: [id number] DOB: 1996-01-02  Subjective:   Darren Finley is a 26 y.o. male presenting for 1 day history of acute onset recurrent wheezing, chest tightness, shortness of breath, runny nose.  Patient has a very difficult time with his breathing and asthma at this time of year.  He was supposed to be seen last week by his pulmonologist but there was a schedule change.  He is taking his Breo Ellipta but had run out of it last week and did not get the refill until this morning.  He has been using his albuterol.  Patient typically responds very well to steroids.  He has been cutting back on his vaping.  No longer smokes marijuana.  No current facility-administered medications for this encounter.  Current Outpatient Medications:    albuterol (PROVENTIL) (2.5 MG/3ML) 0.083% nebulizer solution, Take 3 mLs (2.5 mg total) by nebulization every 6 (six) hours as needed for wheezing or shortness of breath., Disp: 75 mL, Rfl: 0   albuterol (VENTOLIN HFA) 108 (90 Base) MCG/ACT inhaler, INHALE 2 PUFFS BY MOUTH EVERY 4 HOURS AS NEEDED FOR WHEEZING OR SHORTNESS OF BREATH, Disp: 18 g, Rfl: 5   amoxicillin-clavulanate (AUGMENTIN) 875-125 MG tablet, Take 1 tablet by mouth 2 (two) times daily., Disp: 14 tablet, Rfl: 0   cetirizine (ZYRTEC) 10 MG tablet, Take 10 mg by mouth daily., Disp: , Rfl:    fluticasone furoate-vilanterol (BREO ELLIPTA) 100-25 MCG/ACT AEPB, Inhale 1 puff into the lungs daily., Disp: 60 each, Rfl: 3   predniSONE (DELTASONE) 50 MG tablet, Take 1 tablet (50 mg total) by mouth daily with breakfast., Disp: 5 tablet, Rfl: 0   pseudoephedrine (SUDAFED) 60 MG tablet, Take 1 tablet (60 mg total) by mouth every 8 (eight) hours as needed for congestion., Disp: 30 tablet, Rfl: 0   No Known Allergies  Past Medical History:  Diagnosis Date    Asthma      History reviewed. No pertinent surgical history.  History reviewed. No pertinent family history.  Social History   Tobacco Use   Smoking status: Former    Types: Cigarettes   Smokeless tobacco: Never  Vaping Use   Vaping Use: Some days   Substances: Nicotine  Substance Use Topics   Alcohol use: Yes    Comment: occ   Drug use: Yes    Types: Marijuana    ROS   Objective:   Vitals: BP 113/75 (BP Location: Right Arm)   Pulse 78   Temp 98.3 F (36.8 C) (Oral)   Resp 18   SpO2 93%   Physical Exam Constitutional:      General: He is not in acute distress.    Appearance: Normal appearance. He is well-developed. He is not ill-appearing, toxic-appearing or diaphoretic.  HENT:     Head: Normocephalic and atraumatic.     Right Ear: External ear normal.     Left Ear: External ear normal.     Nose: Nose normal.     Mouth/Throat:     Mouth: Mucous membranes are moist.  Eyes:     General: No scleral icterus.       Right eye: No discharge.        Left eye: No discharge.     Extraocular Movements: Extraocular movements intact.  Cardiovascular:     Rate and Rhythm: Normal rate and regular rhythm.  Heart sounds: Normal heart sounds. No murmur heard.    No friction rub. No gallop.  Pulmonary:     Effort: Pulmonary effort is normal. No respiratory distress.     Breath sounds: No stridor. Wheezing (throughout) present. No rhonchi or rales.  Neurological:     Mental Status: He is alert and oriented to person, place, and time.  Psychiatric:        Mood and Affect: Mood normal.        Behavior: Behavior normal.        Thought Content: Thought content normal.     Assessment and Plan :   PDMP not reviewed this encounter.  1. Moderate persistent asthma without complication   2. Wheezing     Patient declined steroid injection in clinic.  Recommended a 10-day course of prednisone starting with 50 mg for 5 days followed by 20 mg for the last 5 days.   Provided refill for his Breo Ellipta at 200 mcg/25.  Continue follow-up with his PCP/pulmonologist.  Deferred x-ray for now. Counseled patient on potential for adverse effects with medications prescribed/recommended today, ER and return-to-clinic precautions discussed, patient verbalized understanding.    Wallis Bamberg, PA-C 06/12/22 1043

## 2022-06-12 NOTE — ED Triage Notes (Signed)
Pt c/o wheezing,chest tightness, runny nose-sx started yesterday-states using breo and albuterol inhalers-NAD-steady gait

## 2022-06-12 NOTE — Discharge Instructions (Addendum)
Take 50mg  prednisone daily for 5 days first. Then use the 20mg  prednisone daily after that for 5 days.

## 2022-07-10 ENCOUNTER — Ambulatory Visit
Admission: EM | Admit: 2022-07-10 | Discharge: 2022-07-10 | Disposition: A | Discharge status: Home / Self Care | Payer: Commercial Managed Care - HMO | Attending: Urgent Care | Admitting: Urgent Care

## 2022-07-10 DIAGNOSIS — J4540 Moderate persistent asthma, uncomplicated: Secondary | ICD-10-CM | POA: Diagnosis not present

## 2022-07-10 DIAGNOSIS — J4541 Moderate persistent asthma with (acute) exacerbation: Secondary | ICD-10-CM

## 2022-07-10 MED ORDER — TRIAMCINOLONE ACETONIDE 40 MG/ML IJ SUSP
60.0000 mg | Freq: Once | INTRAMUSCULAR | Status: AC
  Administered 2022-07-10: 60 mg via INTRAMUSCULAR

## 2022-07-10 MED ORDER — PREDNISONE 10 MG PO TABS: 30.0000 mg | ORAL_TABLET | Freq: Every day | ORAL | 0 refills | Status: DC

## 2022-07-10 MED ORDER — FLUTICASONE FUROATE-VILANTEROL 200-25 MCG/ACT IN AEPB: 1.0000 | INHALATION_SPRAY | Freq: Every day | RESPIRATORY_TRACT | 3 refills | Status: DC

## 2022-07-10 NOTE — ED Provider Notes (Signed)
[name]  Note:  This document was prepared using Conservation officer, historic buildings and may include unintentional dictation errors.  MRN: [id number] DOB: [birth year]  Subjective:   Darren Finley is a 26 y.o. male presenting for 4-day history of acute onset recurrent chest congestion, wheezing, sinus symptoms.  Patient has a significant difficulty with his allergies and asthma.  Does not necessarily feel sick.  He is supposed to be on breathing treatments, uses his Breo Ellipta.  Unfortunately he needs the higher dose of 200 mcg but was unable to pick this up as they considered it on early refill from his last visit.  He has not been able to follow-up with his pulmonologist but plans on getting an office visit soon.  Declines a breathing treatment in office.  He is otherwise very compliant with his medications.  No current facility-administered medications for this encounter.  Current Outpatient Medications:    albuterol (PROVENTIL) (2.5 MG/3ML) 0.083% nebulizer solution, Take 3 mLs (2.5 mg total) by nebulization every 6 (six) hours as needed for wheezing or shortness of breath., Disp: 75 mL, Rfl: 0   albuterol (VENTOLIN HFA) 108 (90 Base) MCG/ACT inhaler, INHALE 2 PUFFS BY MOUTH EVERY 4 HOURS AS NEEDED FOR WHEEZING OR SHORTNESS OF BREATH, Disp: 18 g, Rfl: 5   amoxicillin-clavulanate (AUGMENTIN) 875-125 MG tablet, Take 1 tablet by mouth 2 (two) times daily., Disp: 14 tablet, Rfl: 0   cetirizine (ZYRTEC) 10 MG tablet, Take 10 mg by mouth daily., Disp: , Rfl:    fluticasone furoate-vilanterol (BREO ELLIPTA) 200-25 MCG/ACT AEPB, Inhale 1 puff into the lungs daily., Disp: 60 each, Rfl: 3   predniSONE (DELTASONE) 20 MG tablet, Take 1 tablet (20 mg total) by mouth daily with breakfast., Disp: 5 tablet, Rfl: 0   predniSONE (DELTASONE) 50 MG tablet, Take 1 tablet (50 mg total) by mouth daily with breakfast., Disp: 5 tablet, Rfl: 0   pseudoephedrine (SUDAFED) 60 MG tablet,  Take 1 tablet (60 mg total) by mouth every 8 (eight) hours as needed for congestion., Disp: 30 tablet, Rfl: 0   No Known Allergies  Past Medical History:  Diagnosis Date   Asthma      No past surgical history on file.  No family history on file.  Social History   Tobacco Use   Smoking status: Former    Types: Cigarettes   Smokeless tobacco: Never  Vaping Use   Vaping Use: Some days   Substances: Nicotine  Substance Use Topics   Alcohol use: Yes    Comment: occ   Drug use: Yes    Types: Marijuana    ROS   Objective:   Vitals: BP 130/78 (BP Location: Right Arm)   Pulse 74   Temp 98.4 F (36.9 C) (Oral)   Resp (!) 21   SpO2 94%   Physical Exam Constitutional:      General: He is not in acute distress.    Appearance: Normal appearance. He is well-developed. He is not ill-appearing, toxic-appearing or diaphoretic.  HENT:     Head: Normocephalic and atraumatic.     Right Ear: External ear normal.     Left Ear: External ear normal.     Nose: Nose normal.     Mouth/Throat:     Mouth: Mucous membranes are moist.  Eyes:     General: No scleral icterus.       Right eye: No discharge.        Left eye: No discharge.  Extraocular Movements: Extraocular movements intact.  Cardiovascular:     Rate and Rhythm: Normal rate and regular rhythm.     Heart sounds: Normal heart sounds. No murmur heard.    No friction rub. No gallop.  Pulmonary:     Effort: Pulmonary effort is normal. No respiratory distress.     Breath sounds: No stridor. Wheezing (mild diffuse throughout) present. No rhonchi or rales.  Neurological:     Mental Status: He is alert and oriented to person, place, and time.  Psychiatric:        Mood and Affect: Mood normal.        Behavior: Behavior normal.        Thought Content: Thought content normal.     Assessment and Plan :   PDMP not reviewed this encounter.  1. Moderate persistent asthma with acute exacerbation   2. Moderate persistent  asthma, unspecified whether complicated     IM triamcinolone in clinic at 60 mg.  It is an appropriate course of action given his lung sounds, difficulty with his asthma and the last injection that was done was 3 months ago.  I sent a new prescription for Breo Ellipta 200 mcg.  Recommended he try to start this.  Follow-up closely with his pulmonologist.  Maintain all other medications. Counseled patient on potential for adverse effects with medications prescribed/recommended today, ER and return-to-clinic precautions discussed, patient verbalized understanding.    Wallis Bamberg, [place] 07/10/22 [phone]

## 2022-07-10 NOTE — ED Triage Notes (Signed)
Pt c/o sneezing, HA, sore throat, chest congestion x 4 days-feels is r/t to allergies and asthma-NAD-steady gait

## 2022-07-17 ENCOUNTER — Emergency Department (HOSPITAL_BASED_OUTPATIENT_CLINIC_OR_DEPARTMENT_OTHER)
Admission: EM | Admit: 2022-07-17 | Discharge: 2022-07-17 | Disposition: A | Discharge status: Home / Self Care | Payer: Commercial Managed Care - HMO | Attending: Emergency Medicine | Admitting: Emergency Medicine

## 2022-07-17 ENCOUNTER — Other Ambulatory Visit: Payer: Self-pay

## 2022-07-17 DIAGNOSIS — R0602 Shortness of breath: Secondary | ICD-10-CM | POA: Diagnosis present

## 2022-07-17 DIAGNOSIS — Z1152 Encounter for screening for COVID-19: Secondary | ICD-10-CM | POA: Diagnosis not present

## 2022-07-17 DIAGNOSIS — J4541 Moderate persistent asthma with (acute) exacerbation: Secondary | ICD-10-CM

## 2022-07-17 DIAGNOSIS — Z7951 Long term (current) use of inhaled steroids: Secondary | ICD-10-CM | POA: Insufficient documentation

## 2022-07-17 LAB — RESP PANEL BY RT-PCR (RSV, FLU A&B, COVID)  RVPGX2
Influenza A by PCR: NEGATIVE
Influenza B by PCR: NEGATIVE
Resp Syncytial Virus by PCR: NEGATIVE
SARS Coronavirus 2 by RT PCR: NEGATIVE

## 2022-07-17 MED ORDER — PREDNISONE 50 MG PO TABS: 50.0000 mg | ORAL_TABLET | Freq: Every day | ORAL | 0 refills | Status: DC

## 2022-07-17 MED ORDER — IPRATROPIUM-ALBUTEROL 0.5-2.5 (3) MG/3ML IN SOLN: 3.0000 mL | Freq: Once | RESPIRATORY_TRACT | Status: AC

## 2022-07-17 MED ORDER — ALBUTEROL SULFATE HFA 108 (90 BASE) MCG/ACT IN AERS
2.0000 | INHALATION_SPRAY | Freq: Once | RESPIRATORY_TRACT | Status: AC
  Administered 2022-07-17: 2 via RESPIRATORY_TRACT
  Filled 2022-07-17: qty 6.7

## 2022-07-17 MED ORDER — IPRATROPIUM-ALBUTEROL 0.5-2.5 (3) MG/3ML IN SOLN
RESPIRATORY_TRACT | Status: AC
  Administered 2022-07-17: 3 mL via RESPIRATORY_TRACT
  Filled 2022-07-17: qty 3

## 2022-07-17 MED ORDER — PREDNISONE 50 MG PO TABS
60.0000 mg | ORAL_TABLET | Freq: Once | ORAL | Status: AC
  Administered 2022-07-17: 60 mg via ORAL
  Filled 2022-07-17: qty 1

## 2022-07-17 MED ORDER — ALBUTEROL SULFATE (2.5 MG/3ML) 0.083% IN NEBU
INHALATION_SOLUTION | RESPIRATORY_TRACT | Status: AC
  Administered 2022-07-17: 2.5 mg via RESPIRATORY_TRACT
  Filled 2022-07-17: qty 3

## 2022-07-17 MED ORDER — QVAR REDIHALER 80 MCG/ACT IN AERB: 2.0000 | INHALATION_SPRAY | Freq: Two times a day (BID) | RESPIRATORY_TRACT | 2 refills | Status: DC

## 2022-07-17 MED ORDER — ALBUTEROL SULFATE (2.5 MG/3ML) 0.083% IN NEBU: 2.5000 mg | INHALATION_SOLUTION | Freq: Once | RESPIRATORY_TRACT | Status: AC

## 2022-07-17 NOTE — ED Provider Notes (Signed)
[facility] EMERGENCY DEPARTMENT Provider Note   CSN: [id number] Arrival date & time: 07/17/22  0831     History {Add pertinent medical, surgical, social history, OB history to HPI:1} Chief Complaint  Patient presents with   Asthma Exacerbation    Darren Finley is a 26 y.o. male.  He has a history of asthma.  Complaining of an asthma flareup for the last couple of days.  He is non-smoker although does vape.  Using albuterol inhaler without improvement.  No fevers chills nausea vomiting  The history is provided by the patient.  Shortness of Breath Severity:  Moderate Onset quality:  Gradual Duration:  2 days Timing:  Constant Progression:  Unchanged Chronicity:  Recurrent Relieved by:  Nothing Worsened by:  Activity Ineffective treatments:  Inhaler Associated symptoms: cough and wheezing   Associated symptoms: no abdominal pain, no chest pain, no fever and no sputum production        Home Medications Prior to Admission medications   Medication Sig Start Date End Date Taking? Authorizing Provider  albuterol (PROVENTIL) (2.5 MG/3ML) 0.083% nebulizer solution Take 3 mLs (2.5 mg total) by nebulization every 6 (six) hours as needed for wheezing or shortness of breath. 04/23/22   Wallis Bamberg, PA-C  albuterol (VENTOLIN HFA) 108 (90 Base) MCG/ACT inhaler INHALE 2 PUFFS BY MOUTH EVERY 4 HOURS AS NEEDED FOR WHEEZING OR SHORTNESS OF BREATH 04/12/22   Wallis Bamberg, PA-C  amoxicillin-clavulanate (AUGMENTIN) 875-125 MG tablet Take 1 tablet by mouth 2 (two) times daily. 05/25/22   Wallis Bamberg, PA-C  cetirizine (ZYRTEC) 10 MG tablet Take 10 mg by mouth daily.    [provider]  fluticasone furoate-vilanterol (BREO ELLIPTA) 200-25 MCG/ACT AEPB Inhale 1 puff into the lungs daily. 07/10/22   Wallis Bamberg, PA-C  predniSONE (DELTASONE) 10 MG tablet Take 3 tablets (30 mg total) by mouth daily with breakfast. 07/10/22   Wallis Bamberg, PA-C  pseudoephedrine (SUDAFED) 60 MG tablet  Take 1 tablet (60 mg total) by mouth every 8 (eight) hours as needed for congestion. 05/25/22   Wallis Bamberg, PA-C  fluticasone (FLONASE) 50 MCG/ACT nasal spray Place 2 sprays into both nostrils daily. Patient not taking: Reported on 05/24/2018 04/27/18 07/02/19  Emi Holes, PA-C  loratadine (CLARITIN) 10 MG tablet Take 1 tablet (10 mg total) by mouth daily. 04/17/19 05/31/20  Palumbo, April, MD      Allergies    Patient has no known allergies.    Review of Systems   Review of Systems  Constitutional:  Negative for fever.  Respiratory:  Positive for cough, shortness of breath and wheezing. Negative for sputum production.   Cardiovascular:  Negative for chest pain.  Gastrointestinal:  Negative for abdominal pain.    Physical Exam Updated Vital Signs BP 117/85 (BP Location: Left Arm)   Pulse 92   Temp 97.8 F (36.6 C) (Oral)   Resp 18   Wt 115.7 kg   SpO2 97%   BMI 29.47 kg/m  Physical Exam Vitals and nursing note reviewed.  Constitutional:      General: He is not in acute distress.    Appearance: Normal appearance. He is well-developed.  HENT:     Head: Normocephalic and atraumatic.  Eyes:     Conjunctiva/sclera: Conjunctivae normal.  Cardiovascular:     Rate and Rhythm: Normal rate and regular rhythm.     Heart sounds: No murmur heard. Pulmonary:     Effort: Pulmonary effort is normal. No respiratory distress.  Breath sounds: Wheezing present.  Abdominal:     Palpations: Abdomen is soft.     Tenderness: There is no abdominal tenderness.  Musculoskeletal:        General: No swelling.     Cervical back: Neck supple.  Skin:    General: Skin is warm and dry.     Capillary Refill: Capillary refill takes less than 2 seconds.  Neurological:     General: No focal deficit present.     Mental Status: He is alert.     ED Results / Procedures / Treatments   Labs (all labs ordered are listed, but only abnormal results are displayed) Labs Reviewed - No data to  display  EKG None  Radiology No results found.  Procedures Procedures  {Document cardiac monitor, telemetry assessment procedure when appropriate:1}  Medications Ordered in ED Medications  predniSONE (DELTASONE) tablet 60 mg (has no administration in time range)  ipratropium-albuterol (DUONEB) 0.5-2.5 (3) MG/3ML nebulizer solution 3 mL (3 mLs Nebulization Given 07/17/22 0848)  albuterol (PROVENTIL) (2.5 MG/3ML) 0.083% nebulizer solution 2.5 mg (2.5 mg Nebulization Given 07/17/22 0848)    ED Course/ Medical Decision Making/ A&P                           Medical Decision Making Risk Prescription drug management.   This patient complains of ***; this involves an extensive number of treatment Options and is a complaint that carries with it a high risk of complications and morbidity. The differential includes ***  I ordered, reviewed and interpreted labs, which included *** I ordered medication *** and reviewed PMP when indicated. I ordered imaging studies which included *** and I independently    visualized and interpreted imaging which showed *** Additional history obtained from *** Previous records obtained and reviewed *** I consulted *** and discussed lab and imaging findings and discussed disposition.  Cardiac monitoring reviewed, *** Social determinants considered, *** Critical Interventions: ***  After the interventions stated above, I reevaluated the patient and found *** Admission and further testing considered, ***   {Document critical care time when appropriate:1} {Document review of labs and clinical decision tools ie heart score, Chads2Vasc2 etc:1}  {Document your independent review of radiology images, and any outside records:1} {Document your discussion with family members, caretakers, and with consultants:1} {Document social determinants of health affecting pt's care:1} {Document your decision making why or why not admission, treatments were  needed:1} Final Clinical Impression(s) / ED Diagnoses Final diagnoses:  None    Rx / DC Orders ED Discharge Orders     None

## 2022-07-17 NOTE — ED Triage Notes (Signed)
Pt arrives pov, steady gait, endorses asthma flareup". Reports wheezing and chest tightness , difficulty sleeping last night. Last used inhaler 2 hrs pta

## 2022-07-17 NOTE — Discharge Instructions (Signed)
You were seen in the emergency department for shortness of breath.  Your COVID and flu test were negative.  We are starting you on prednisone.  Please continue albuterol.  We are prescribing you an inhaler once your acute exacerbation is improved.  Please contact your pulmonologist for close follow-up.  Return to the emergency department if any worsening or concerning symptoms

## 2022-08-31 ENCOUNTER — Ambulatory Visit
Admission: EM | Admit: 2022-08-31 | Discharge: 2022-08-31 | Disposition: A | Discharge status: Home / Self Care | Payer: Medicaid Other | Attending: Urgent Care | Admitting: Urgent Care

## 2022-08-31 DIAGNOSIS — M5416 Radiculopathy, lumbar region: Secondary | ICD-10-CM | POA: Diagnosis not present

## 2022-08-31 MED ORDER — PREDNISONE 50 MG PO TABS: 50.0000 mg | ORAL_TABLET | Freq: Every day | ORAL | 0 refills | Status: DC

## 2022-08-31 MED ORDER — TIZANIDINE HCL 4 MG PO TABS: 4.0000 mg | ORAL_TABLET | Freq: Every day | ORAL | 0 refills | Status: DC

## 2022-08-31 MED ORDER — ACETAMINOPHEN 325 MG PO TABS: 650.0000 mg | ORAL_TABLET | Freq: Four times a day (QID) | ORAL | 0 refills | Status: AC | PRN

## 2022-08-31 NOTE — ED Provider Notes (Signed)
[name]  Note:  This document was prepared using Systems analyst and may include unintentional dictation errors.  MRN: [id number] DOB: Jun 13, 1996  Subjective:   Darren Finley is a 27 y.o. male presenting for 2 to 3-day history of acute onset recurrent lower back pain.  Patient states that it is now radiating down both of his legs.  He saw chiropractor yesterday and try to have an adjustment which helped very temporarily.  No fall, trauma, numbness or tingling, saddle paresthesia, changes to bowel or urinary habits, radicular symptoms.  He does do a lot of exercise and tries to practice good back care.  Hydrates well.  No current facility-administered medications for this encounter.  Current Outpatient Medications:    albuterol (PROVENTIL) (2.5 MG/3ML) 0.083% nebulizer solution, Take 3 mLs (2.5 mg total) by nebulization every 6 (six) hours as needed for wheezing or shortness of breath., Disp: 75 mL, Rfl: 0   albuterol (VENTOLIN HFA) 108 (90 Base) MCG/ACT inhaler, INHALE 2 PUFFS BY MOUTH EVERY 4 HOURS AS NEEDED FOR WHEEZING OR SHORTNESS OF BREATH, Disp: 18 g, Rfl: 5   amoxicillin-clavulanate (AUGMENTIN) 875-125 MG tablet, Take 1 tablet by mouth 2 (two) times daily., Disp: 14 tablet, Rfl: 0   beclomethasone (QVAR REDIHALER) 80 MCG/ACT inhaler, Inhale 2 puffs into the lungs 2 (two) times daily., Disp: 1 each, Rfl: 2   cetirizine (ZYRTEC) 10 MG tablet, Take 10 mg by mouth daily., Disp: , Rfl:    fluticasone furoate-vilanterol (BREO ELLIPTA) 200-25 MCG/ACT AEPB, Inhale 1 puff into the lungs daily., Disp: 60 each, Rfl: 3   predniSONE (DELTASONE) 10 MG tablet, Take 3 tablets (30 mg total) by mouth daily with breakfast., Disp: 15 tablet, Rfl: 0   predniSONE (DELTASONE) 50 MG tablet, Take 1 tablet (50 mg total) by mouth daily., Disp: 4 tablet, Rfl: 0   pseudoephedrine (SUDAFED) 60 MG tablet, Take 1 tablet (60 mg total) by mouth every 8 (eight) hours as  needed for congestion., Disp: 30 tablet, Rfl: 0   No Known Allergies  Past Medical History:  Diagnosis Date   Asthma      History reviewed. No pertinent surgical history.  No family history on file.  Social History   Tobacco Use   Smoking status: Former    Types: Cigarettes   Smokeless tobacco: Never  Vaping Use   Vaping Use: Some days   Substances: Nicotine  Substance Use Topics   Alcohol use: Yes    Comment: occ   Drug use: Yes    Types: Marijuana    ROS   Objective:   Vitals: BP 137/81 (BP Location: Right Arm)   Pulse 66   Temp 98 F (36.7 C) (Oral)   Resp 18   SpO2 99%   Physical Exam Constitutional:      General: He is not in acute distress.    Appearance: Normal appearance. He is well-developed and normal weight. He is not ill-appearing, toxic-appearing or diaphoretic.  HENT:     Head: Normocephalic and atraumatic.     Right Ear: External ear normal.     Left Ear: External ear normal.     Nose: Nose normal.     Mouth/Throat:     Pharynx: Oropharynx is clear.  Eyes:     General: No scleral icterus.       Right eye: No discharge.        Left eye: No discharge.     Extraocular Movements: Extraocular movements intact.  Cardiovascular:     Rate and Rhythm: Normal rate.  Pulmonary:     Effort: Pulmonary effort is normal.  Musculoskeletal:     Cervical back: Normal range of motion.     Lumbar back: Tenderness present. No swelling, edema, deformity, signs of trauma, lacerations, spasms or bony tenderness. Normal range of motion. Positive right straight leg raise test and positive left straight leg raise test. No scoliosis.  Neurological:     Mental Status: He is alert and oriented to person, place, and time.  Psychiatric:        Mood and Affect: Mood normal.        Behavior: Behavior normal.        Thought Content: Thought content normal.        Judgment: Judgment normal.      Assessment and Plan :   PDMP not reviewed this encounter.  1.  Lumbar radiculopathy     Patient has lumbar radiculopathy and has not responded to use of NSAIDs.  Recommended prednisone, Tylenol, tizanidine.  Deferred imaging as it is of low yield for this particular problem.  Recommended follow-up with a spine specialty clinic.  No sign of spinal emergency. Counseled patient on potential for adverse effects with medications prescribed/recommended today, ER and return-to-clinic precautions discussed, patient verbalized understanding.    Jaynee Eagles, [place] 09/01/22 [phone]

## 2022-08-31 NOTE — ED Triage Notes (Signed)
Pt states he woke yesterday am with mid/lower back pain that radiates down both legs-denies injury-seen by chiropractor yesterday-NAD-steady gait

## 2022-09-14 ENCOUNTER — Ambulatory Visit (INDEPENDENT_AMBULATORY_CARE_PROVIDER_SITE_OTHER): Payer: Medicaid Other

## 2022-09-14 ENCOUNTER — Ambulatory Visit
Admission: EM | Admit: 2022-09-14 | Discharge: 2022-09-14 | Disposition: A | Discharge status: Home / Self Care | Payer: Medicaid Other | Attending: Urgent Care | Admitting: Urgent Care

## 2022-09-14 DIAGNOSIS — J4540 Moderate persistent asthma, uncomplicated: Secondary | ICD-10-CM

## 2022-09-14 DIAGNOSIS — M5416 Radiculopathy, lumbar region: Secondary | ICD-10-CM | POA: Diagnosis not present

## 2022-09-14 DIAGNOSIS — J45901 Unspecified asthma with (acute) exacerbation: Secondary | ICD-10-CM | POA: Diagnosis not present

## 2022-09-14 DIAGNOSIS — M545 Low back pain, unspecified: Secondary | ICD-10-CM

## 2022-09-14 MED ORDER — CYCLOBENZAPRINE HCL 5 MG PO TABS: 5.0000 mg | ORAL_TABLET | Freq: Three times a day (TID) | ORAL | 0 refills | Status: DC | PRN

## 2022-09-14 MED ORDER — TRIAMCINOLONE ACETONIDE 40 MG/ML IJ SUSP
80.0000 mg | Freq: Once | INTRAMUSCULAR | Status: AC
  Administered 2022-09-14: 80 mg via INTRAMUSCULAR

## 2022-09-14 NOTE — ED Triage Notes (Addendum)
Pt c/o lower back pain x 2 days-denies injury-states he was seen for same last month-pt also c/o "asthma acting up-wheezing" x 2 days-NAD-steady gait

## 2022-09-14 NOTE — ED Provider Notes (Signed)
[name]  Note:  This document was prepared using Systems analyst and may include unintentional dictation errors.  MRN: [id number] DOB: [birth year]  Subjective:   Darren Finley is a 27 y.o. male presenting for recurrent low back pain that radiates to either side of his legs.  Patient was seen for the same issue 2 weeks ago.  Underwent a course of prednisone.  He was supposed to follow-up with his orthopedist but has not.  The prednisone did help him but symptoms have returned this week. No fall, trauma, numbness or tingling, saddle paresthesia, changes to bowel or urinary habits.  Patient has also had recurrence of his wheezing, shortness of breath, chest tightness.  He is compliant with his asthma treatments.  No smoking.  No fever, body pains, chest pain.  No sick symptoms.   No current facility-administered medications for this encounter.  Current Outpatient Medications:    acetaminophen (TYLENOL) 325 MG tablet, Take 2 tablets (650 mg total) by mouth every 6 (six) hours as needed for moderate pain., Disp: 30 tablet, Rfl: 0   albuterol (PROVENTIL) (2.5 MG/3ML) 0.083% nebulizer solution, Take 3 mLs (2.5 mg total) by nebulization every 6 (six) hours as needed for wheezing or shortness of breath., Disp: 75 mL, Rfl: 0   albuterol (VENTOLIN HFA) 108 (90 Base) MCG/ACT inhaler, INHALE 2 PUFFS BY MOUTH EVERY 4 HOURS AS NEEDED FOR WHEEZING OR SHORTNESS OF BREATH, Disp: 18 g, Rfl: 5   beclomethasone (QVAR REDIHALER) 80 MCG/ACT inhaler, Inhale 2 puffs into the lungs 2 (two) times daily., Disp: 1 each, Rfl: 2   cetirizine (ZYRTEC) 10 MG tablet, Take 10 mg by mouth daily., Disp: , Rfl:    fluticasone furoate-vilanterol (BREO ELLIPTA) 200-25 MCG/ACT AEPB, Inhale 1 puff into the lungs daily., Disp: 60 each, Rfl: 3   predniSONE (DELTASONE) 50 MG tablet, Take 1 tablet (50 mg total) by mouth daily with breakfast., Disp: 5 tablet, Rfl: 0   pseudoephedrine (SUDAFED)  60 MG tablet, Take 1 tablet (60 mg total) by mouth every 8 (eight) hours as needed for congestion., Disp: 30 tablet, Rfl: 0   tiZANidine (ZANAFLEX) 4 MG tablet, Take 1 tablet (4 mg total) by mouth at bedtime., Disp: 30 tablet, Rfl: 0   No Known Allergies  Past Medical History:  Diagnosis Date   Asthma      History reviewed. No pertinent surgical history.  No family history on file.  Social History   Tobacco Use   Smoking status: Former    Types: Cigarettes   Smokeless tobacco: Never  Vaping Use   Vaping Use: Some days   Substances: Nicotine  Substance Use Topics   Alcohol use: Yes    Comment: occ   Drug use: Yes    Types: Marijuana    ROS   Objective:   Vitals: BP 112/60 (BP Location: Right Arm)   Pulse 67   Temp 98 F (36.7 C) (Oral)   Resp 20   SpO2 98%   Physical Exam Constitutional:      General: He is not in acute distress.    Appearance: Normal appearance. He is well-developed and normal weight. He is not ill-appearing, toxic-appearing or diaphoretic.  HENT:     Head: Normocephalic and atraumatic.     Right Ear: External ear normal.     Left Ear: External ear normal.     Nose: Nose normal.     Mouth/Throat:     Mouth: Mucous membranes are moist.  Eyes:     General: No scleral icterus.       Right eye: No discharge.        Left eye: No discharge.     Extraocular Movements: Extraocular movements intact.  Cardiovascular:     Rate and Rhythm: Normal rate and regular rhythm.     Heart sounds: Normal heart sounds. No murmur heard.    No friction rub. No gallop.  Pulmonary:     Effort: Pulmonary effort is normal. No respiratory distress.     Breath sounds: No stridor. Wheezing (throughout) present. No rhonchi or rales.  Musculoskeletal:     Cervical back: Normal range of motion.     Lumbar back: Spasms and tenderness present. No swelling, edema, deformity, signs of trauma, lacerations or bony tenderness. Normal range of motion. Positive right  straight leg raise test and positive left straight leg raise test. No scoliosis.  Neurological:     Mental Status: He is alert and oriented to person, place, and time.  Psychiatric:        Mood and Affect: Mood normal.        Behavior: Behavior normal.        Thought Content: Thought content normal.        Judgment: Judgment normal.     DG Lumbar Spine Complete  Result Date: 09/14/2022 CLINICAL DATA:  Low back pain for 2 days. No known injury. EXAM: LUMBAR SPINE - COMPLETE 4+ VIEW COMPARISON:  None Available. FINDINGS: There are 5 non-rib-bearing lumbar vertebra. The alignment is maintained. Vertebral body heights are normal. There is no listhesis. The posterior elements are intact. Disc spaces are preserved. No fracture, pars defects or focal bone abnormality. Sacroiliac joints are symmetric and normal. IMPRESSION: Negative radiographs of the lumbar spine. Electronically Signed   By: Keith Rake M.D.   On: 09/14/2022 18:46    IM Kenalog administered in clinic at a dose of 80 mg.  Assessment and Plan :   PDMP not reviewed this encounter.  1. Moderate persistent asthma without complication   2. Lumbar radiculopathy   3. Mild asthma exacerbation     IM Kenalog was used as above which I expect will help both with his asthma and lumbar radiculopathy.  Emphasized need for follow-up with his pulmonologist and also his orthopedist.  Patient would likely need an MRI and this can be done only through his orthopedist. Counseled patient on potential for adverse effects with medications prescribed/recommended today, ER and return-to-clinic precautions discussed, patient verbalized understanding.    Jaynee Eagles, [place] 09/15/22 [phone]

## 2023-01-24 ENCOUNTER — Ambulatory Visit
Admission: EM | Admit: 2023-01-24 | Discharge: 2023-01-24 | Disposition: A | Discharge status: Home / Self Care | Payer: Medicaid Other | Attending: Physician Assistant | Admitting: Physician Assistant

## 2023-01-24 DIAGNOSIS — J4541 Moderate persistent asthma with (acute) exacerbation: Secondary | ICD-10-CM

## 2023-01-24 MED ORDER — IPRATROPIUM-ALBUTEROL 0.5-2.5 (3) MG/3ML IN SOLN
3.0000 mL | Freq: Once | RESPIRATORY_TRACT | Status: AC
  Administered 2023-01-24: 3 mL via RESPIRATORY_TRACT

## 2023-01-24 MED ORDER — METHYLPREDNISOLONE ACETATE 80 MG/ML IJ SUSP
80.0000 mg | Freq: Once | INTRAMUSCULAR | Status: AC
  Administered 2023-01-24: 80 mg via INTRAMUSCULAR

## 2023-01-24 MED ORDER — METHYLPREDNISOLONE ACETATE 40 MG/ML IJ SUSP: 60.0000 mg | Freq: Once | INTRAMUSCULAR | Status: DC

## 2023-01-24 MED ORDER — PREDNISONE 10 MG (21) PO TBPK: ORAL_TABLET | Freq: Every day | ORAL | 0 refills | Status: DC

## 2023-01-24 MED ORDER — BUDESONIDE-FORMOTEROL FUMARATE 80-4.5 MCG/ACT IN AERO: 2.0000 | INHALATION_SPRAY | Freq: Two times a day (BID) | RESPIRATORY_TRACT | 1 refills | Status: DC

## 2023-01-24 NOTE — Discharge Instructions (Signed)
We are treating you for an asthma exacerbation.  We gave you injection of steroids today.  Please start prednisone taper tomorrow (01/25/2023).  Do not take NSAIDs with this medication including aspirin, ibuprofen/Advil, naproxen/Aleve.  Continue your allergy medication as previously prescribed.  Stop Qvar and switch to Symbicort.  Rinse your mouth following use of this medication to prevent thrush.  Follow-up with your primary care soon as possible as he may benefit from seeing a specialist given your persistent symptoms.  If anything worsens or if your symptoms or not improving please be seen immediately.

## 2023-01-24 NOTE — ED Triage Notes (Signed)
Pt presents to [facility] w/ c/o chest tightness, wheezing x2 days. Has been using inhaler.

## 2023-01-24 NOTE — ED Provider Notes (Signed)
[facility]    CSN: [id number] Arrival date & time: 01/24/23  1949      History   Chief Complaint No chief complaint on file.   HPI Darren Finley is a 27 y.o. male.   Patient presents today with a 2-day history of asthma exacerbation.  Reports cough, chest tightness, wheezing.  Denies any additional symptoms including congestion, fever, nausea, vomiting.  He does have a history of asthma and has been using his albuterol inhaler every 2 hours with minimal improvement of symptoms.  He has prescribed Qvar and has been using this without improvement of symptoms.  There is a previous prescription for [facility] but he reports he has not taken this anytime recently.  He does have seasonal allergies and believes this is the trigger of his symptoms.  He has been taking cetirizine daily.  Denies previous hospitalization or intubation related to his asthma.  He is having difficulty with daily activities as even talking as part of his job is difficult as a result of his asthma.  Denies any chest pain, shortness of breath, lightheadedness, weakness.  He denies any recent antibiotic use.  Does report that approximately month ago he had COVID and was given a short course of prednisone following this illness to help with his breathing from his primary care (believes this was 20 mg for 5 days approximately 2 weeks ago).     Past Medical History:  Diagnosis Date   Asthma     There are no problems to display for this patient.   History reviewed. No pertinent surgical history.     Home Medications    Prior to Admission medications   Medication Sig Start Date End Date Taking? Authorizing Provider  budesonide-formoterol (SYMBICORT) 80-4.5 MCG/ACT inhaler Inhale 2 puffs into the lungs in the morning and at bedtime. 01/24/23  Yes Pola Furno K, PA-C  predniSONE (STERAPRED UNI-PAK 21 TAB) 10 MG (21) TBPK tablet Take by mouth daily. Take 6 tabs by mouth daily  for 2 days, then 5 tabs for 2  days, then 4 tabs for 2 days, then 3 tabs for 2 days, 2 tabs for 2 days, then 1 tab by mouth daily for 2 days 01/24/23  Yes Jael Kostick K, PA-C  acetaminophen (TYLENOL) 325 MG tablet Take 2 tablets (650 mg total) by mouth every 6 (six) hours as needed for moderate pain. 08/31/22   Wallis Bamberg, PA-C  albuterol (PROVENTIL) (2.5 MG/3ML) 0.083% nebulizer solution Take 3 mLs (2.5 mg total) by nebulization every 6 (six) hours as needed for wheezing or shortness of breath. 04/23/22   Wallis Bamberg, PA-C  albuterol (VENTOLIN HFA) 108 (90 Base) MCG/ACT inhaler INHALE 2 PUFFS BY MOUTH EVERY 4 HOURS AS NEEDED FOR WHEEZING OR SHORTNESS OF BREATH 04/12/22   Wallis Bamberg, PA-C  cetirizine (ZYRTEC) 10 MG tablet Take 10 mg by mouth daily.    [provider]  cyclobenzaprine (FLEXERIL) 5 MG tablet Take 1 tablet (5 mg total) by mouth 3 (three) times daily as needed for muscle spasms. 09/14/22   Wallis Bamberg, PA-C  pseudoephedrine (SUDAFED) 60 MG tablet Take 1 tablet (60 mg total) by mouth every 8 (eight) hours as needed for congestion. 05/25/22   Wallis Bamberg, PA-C  tiZANidine (ZANAFLEX) 4 MG tablet Take 1 tablet (4 mg total) by mouth at bedtime. 08/31/22   Wallis Bamberg, PA-C  fluticasone (FLONASE) 50 MCG/ACT nasal spray Place 2 sprays into both nostrils daily. Patient not taking: Reported on 05/24/2018 04/27/18 07/02/19  Law, Alexandra M, PA-C  loratadine (CLARITIN) 10 MG tablet Take 1 tablet (10 mg total) by mouth daily. 04/17/19 05/31/20  Palumbo, April, MD    Family History History reviewed. No pertinent family history.  Social History Social History   Tobacco Use   Smoking status: Former    Types: Cigarettes   Smokeless tobacco: Never  Vaping Use   Vaping Use: Every day   Substances: Nicotine  Substance Use Topics   Alcohol use: Yes    Comment: occ   Drug use: Yes    Types: Marijuana     Allergies   Patient has no known allergies.   Review of Systems Review of Systems  Constitutional:  Positive for  activity change. Negative for appetite change, fatigue and fever.  HENT:  Negative for congestion, sinus pressure, sneezing and sore throat.   Respiratory:  Positive for cough, chest tightness, shortness of breath and wheezing.   Cardiovascular:  Negative for chest pain.  Gastrointestinal:  Negative for abdominal pain, diarrhea, nausea and vomiting.     Physical Exam Triage Vital Signs ED Triage Vitals  Enc Vitals Group     BP 01/24/23 1954 119/66     Pulse Rate 01/24/23 1954 75     Resp 01/24/23 1954 16     Temp 01/24/23 1954 98.2 F (36.8 C)     Temp Source 01/24/23 1954 Oral     SpO2 01/24/23 1954 95 %     Weight --      Height --      Head Circumference --      Peak Flow --      Pain Score 01/24/23 1955 0     Pain Loc --      Pain Edu? --      Excl. in GC? --    No data found.  Updated Vital Signs BP 119/66 (BP Location: Right Arm)   Pulse 75   Temp 98.2 F (36.8 C) (Oral)   Resp 16   SpO2 98%   Visual Acuity Right Eye Distance:   Left Eye Distance:   Bilateral Distance:    Right Eye Near:   Left Eye Near:    Bilateral Near:     Physical Exam Vitals reviewed.  Constitutional:      General: He is awake.     Appearance: Normal appearance. He is well-developed. He is not ill-appearing.     Comments: Very pleasant male appears stated age in no acute distress sitting comfortably in exam room  HENT:     Head: Normocephalic and atraumatic.     Right Ear: Tympanic membrane, ear canal and external ear normal. Tympanic membrane is not erythematous or bulging.     Left Ear: Tympanic membrane, ear canal and external ear normal. Tympanic membrane is not erythematous or bulging.     Nose: Nose normal.     Mouth/Throat:     Pharynx: Uvula midline. No oropharyngeal exudate or posterior oropharyngeal erythema.  Cardiovascular:     Rate and Rhythm: Normal rate and regular rhythm.     Heart sounds: Normal heart sounds, S1 normal and S2 normal. No murmur  heard. Pulmonary:     Effort: Pulmonary effort is normal. No accessory muscle usage or respiratory distress.     Breath sounds: No stridor. Wheezing present. No rhonchi or rales.     Comments: Scattered wheezing Abdominal:     General: Bowel sounds are normal.     Palpations: Abdomen is soft.  Tenderness: There is no abdominal tenderness.  Neurological:     Mental Status: He is alert.  Psychiatric:        Behavior: Behavior is cooperative.      UC Treatments / Results  Labs (all labs ordered are listed, but only abnormal results are displayed) Labs Reviewed - No data to display  EKG   Radiology No results found.  Procedures Procedures (including critical care time)  Medications Ordered in UC Medications  ipratropium-albuterol (DUONEB) 0.5-2.5 (3) MG/3ML nebulizer solution 3 mL (3 mLs Nebulization Given 01/24/23 2009)  methylPREDNISolone acetate (DEPO-MEDROL) injection 80 mg (80 mg Intramuscular Given 01/24/23 2008)    Initial Impression / Assessment and Plan / UC Course  I have reviewed the triage vital signs and the nursing notes.  Pertinent labs & imaging results that were available during my care of the patient were reviewed by me and considered in my medical decision making (see chart for details).     Patient is well-appearing, afebrile, nontoxic, nontachycardic.  He did have wheezing on exam that improved with 80 mg of Depo-Medrol and DuoNeb in clinic.  X-ray was deferred after his wheezing improved following in clinic medications.  Will start prednisone taper tomorrow (01/25/2023) and he was instructed not to take NSAIDs with this medication due to risk of GI bleeding.  He can continue using albuterol as needed for acute shortness of breath but will switch from Qvar to Symbicort in the hopes that LABA provides additional relief.  Symbicort was sent to pharmacy.  Recommended that he rinse his mouth following use of this medication to prevent thrush.  Discussed that he  has to follow-up closely with his primary care and given his recurrent symptoms it may be worthwhile to see allergy/asthma specialist.  Discussed that if he has any worsening or changing symptoms in the meantime he needs to go to the emergency room including persistent wheezing/shortness of breath despite medication regimen, chest pain, palpitations, lightheadedness, weakness.  Strict return precautions given.  Work excuse note provided.  Final Clinical Impressions(s) / UC Diagnoses   Final diagnoses:  Moderate persistent asthma with acute exacerbation     Discharge Instructions      We are treating you for an asthma exacerbation.  We gave you injection of steroids today.  Please start prednisone taper tomorrow (01/25/2023).  Do not take NSAIDs with this medication including aspirin, ibuprofen/Advil, naproxen/Aleve.  Continue your allergy medication as previously prescribed.  Stop Qvar and switch to Symbicort.  Rinse your mouth following use of this medication to prevent thrush.  Follow-up with your primary care soon as possible as he may benefit from seeing a specialist given your persistent symptoms.  If anything worsens or if your symptoms or not improving please be seen immediately.     ED Prescriptions     Medication Sig Dispense Auth. Provider   budesonide-formoterol (SYMBICORT) 80-4.5 MCG/ACT inhaler Inhale 2 puffs into the lungs in the morning and at bedtime. 1 each Damian Hofstra K, PA-C   predniSONE (STERAPRED UNI-PAK 21 TAB) 10 MG (21) TBPK tablet Take by mouth daily. Take 6 tabs by mouth daily  for 2 days, then 5 tabs for 2 days, then 4 tabs for 2 days, then 3 tabs for 2 days, 2 tabs for 2 days, then 1 tab by mouth daily for 2 days 42 tablet Fleurette Woolbright K, PA-C      PDMP not reviewed this encounter.   Jeani Hawking, PA-C 01/24/23 2014

## 2023-07-04 ENCOUNTER — Ambulatory Visit
Admission: EM | Admit: 2023-07-04 | Discharge: 2023-07-04 | Disposition: A | Discharge status: Home / Self Care | Payer: Self-pay | Attending: Family Medicine | Admitting: Family Medicine

## 2023-07-04 ENCOUNTER — Ambulatory Visit: Payer: Self-pay

## 2023-07-04 DIAGNOSIS — R0602 Shortness of breath: Secondary | ICD-10-CM

## 2023-07-04 DIAGNOSIS — J4521 Mild intermittent asthma with (acute) exacerbation: Secondary | ICD-10-CM

## 2023-07-04 MED ORDER — IPRATROPIUM-ALBUTEROL 0.5-2.5 (3) MG/3ML IN SOLN
3.0000 mL | Freq: Once | RESPIRATORY_TRACT | Status: AC
  Administered 2023-07-04: 3 mL via RESPIRATORY_TRACT

## 2023-07-04 MED ORDER — PREDNISONE 50 MG PO TABS
ORAL_TABLET | ORAL | 0 refills | Status: DC
Start: 2023-07-04 — End: 2023-12-30

## 2023-07-04 MED ORDER — ALBUTEROL SULFATE HFA 108 (90 BASE) MCG/ACT IN AERS: INHALATION_SPRAY | RESPIRATORY_TRACT | 5 refills | Status: DC

## 2023-07-04 MED ORDER — ALBUTEROL SULFATE (2.5 MG/3ML) 0.083% IN NEBU: 2.5000 mg | INHALATION_SOLUTION | Freq: Four times a day (QID) | RESPIRATORY_TRACT | 1 refills | Status: DC | PRN

## 2023-07-04 NOTE — ED Triage Notes (Signed)
Pt c/o "a cold the past week to 2 weeks"-c/o cough, SHOB, wheezing, "asthma" x 4 days-using neb and inhaler w/o relief-pt NAD-steady gait

## 2023-07-07 NOTE — ED Provider Notes (Signed)
[facility]   [id number] 07/04/23 Arrival Time: 1504  ASSESSMENT & PLAN:  1. SOB (shortness of breath)   2. Mild intermittent asthma with acute exacerbation    I have personally viewed and independently interpreted the imaging studies ordered this visit. CXR: no acute changes; no PNA.  Feeling better after duoneb.  Meds ordered this encounter  Medications   albuterol (PROVENTIL) (2.5 MG/3ML) 0.083% nebulizer solution    Sig: Take 3 mLs (2.5 mg total) by nebulization every 6 (six) hours as needed for wheezing or shortness of breath.    Dispense:  75 mL    Refill:  1   albuterol (VENTOLIN HFA) 108 (90 Base) MCG/ACT inhaler    Sig: INHALE 2 PUFFS BY MOUTH EVERY 4 HOURS AS NEEDED FOR WHEEZING OR SHORTNESS OF BREATH    Dispense:  26 each    Refill:  5   predniSONE (DELTASONE) 50 MG tablet    Sig: Take one tablet by mouth for 5 days.    Dispense:  5 tablet    Refill:  0   ipratropium-albuterol (DUONEB) 0.5-2.5 (3) MG/3ML nebulizer solution 3 mL    Asthma precautions given. OTC symptom care as needed.  Recommend:  Follow-up Information     [facility]).   Specialty: Urgent Care Why: If worsening or failing to improve as anticipated. Contact information: [address], [place] [address] [phone]                Reviewed expectations re: course of current medical issues. Questions answered. Outlined signs and symptoms indicating need for more acute intervention. Patient verbalized understanding. After Visit Summary given.  SUBJECTIVE: History from: patient.  Darren Finley is a 27 y.o. male who presents with complaint of "a cold for the past 2 weeks"; coughing, feeling SOB at times. "Asthma is flaring up"; wheezing now. Home neb without much help. Denies CP/fever.  Social History   Tobacco Use  Smoking Status Former   Types: Cigarettes  Smokeless Tobacco  Never    OBJECTIVE:  Vitals:   07/04/23 1537  BP: 115/69  Pulse: 73  Resp: 20  Temp: 98 F (36.7 C)  SpO2: 94%    General appearance: alert; NAD HEENT: [place]; AT; with mild nasal congestion Neck: supple without LAD Cv: RRR without murmer Lungs: unlabored respirations, moderate bilateral expiratory wheezing; cough: mild; no significant respiratory distress Skin: warm and dry Psychological: alert and cooperative; normal mood and affect  Imaging: DG Chest 2 View Result Date: 07/04/2023 CLINICAL DATA:  Cough and shortness of breath EXAM: CHEST - 2 VIEW COMPARISON:  10/08/2021 FINDINGS: The heart size and mediastinal contours are within normal limits. Both lungs are clear. The visualized skeletal structures are unremarkable. IMPRESSION: No active cardiopulmonary disease. Electronically Signed   By: Judie Petit.  Shick M.D.   On: 07/04/2023 16:43    No Known Allergies  Past Medical History:  Diagnosis Date   Asthma    No family history on file. Social History   Socioeconomic History   Marital status: Single    Spouse name: Not on file   Number of children: Not on file   Years of education: Not on file   Highest education level: Not on file  Occupational History   Not on file  Tobacco Use   Smoking status: Former    Types: Cigarettes   Smokeless tobacco: Never  Vaping Use   Vaping status: Every Day  Substances: Nicotine  Substance and Sexual Activity   Alcohol use: Yes    Comment: occ   Drug use: Yes    Types: Marijuana   Sexual activity: Not on file  Other Topics Concern   Not on file  Social History Narrative   Not on file   Social Drivers of Health   Financial Resource Strain: Not on file  Food Insecurity: Medium Risk (12/12/2022)   Received from [facility]   Food vital sign    Within the past 12 months, you worried that your food would run out before you got money to buy more: Sometimes true    Within the past 12 months, the food you bought just didn't last  and you didn't have money to get more. : Sometimes true  Transportation Needs: No Transportation Needs (12/12/2022)   Received from [facility]    In the past 12 months, has lack of reliable transportation kept you from medical appointments, meetings, work or from getting things needed for daily living? : No  Physical Activity: Not on file  Stress: Not on file  Social Connections: Not on file  Intimate Partner Violence: Not on file             Mardella Layman, MD 07/07/23 1037

## 2023-07-20 IMAGING — DX DG CHEST 1V PORT
1 series · 1 of 1 positions shown · non-contrast
Comparison: 07/29/2021

CLINICAL DATA: Shortness of breath

EXAM:
PORTABLE CHEST 1 VIEW

[chest ap]
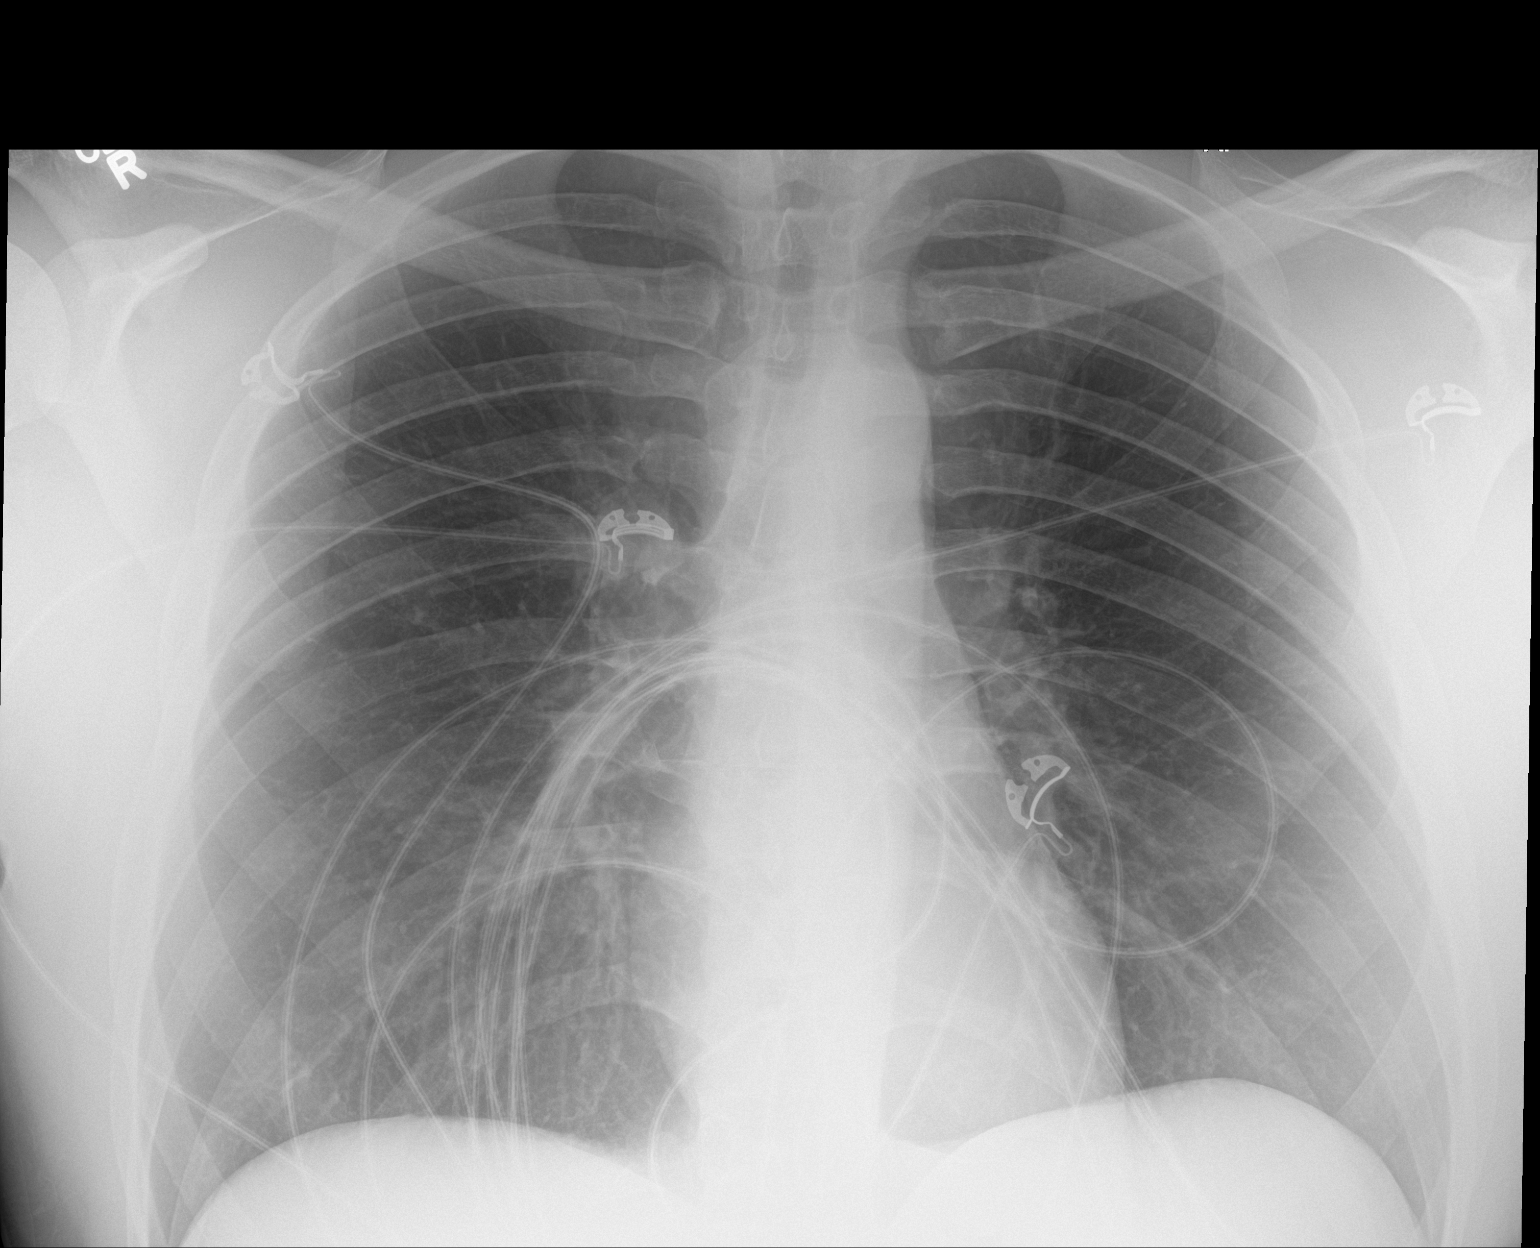

[1 of 1 positions shown; findings below may reference images not displayed]

FINDINGS: Lungs are clear.  No pleural effusion or pneumothorax.

Heart is normal in size.
IMPRESSION: No evidence of acute cardiopulmonary disease.

## 2023-12-30 ENCOUNTER — Ambulatory Visit
Admission: EM | Admit: 2023-12-30 | Discharge: 2023-12-30 | Disposition: A | Discharge status: Home / Self Care | Attending: Nurse Practitioner | Admitting: Nurse Practitioner

## 2023-12-30 ENCOUNTER — Encounter: Payer: Self-pay | Admitting: Emergency Medicine

## 2023-12-30 DIAGNOSIS — M5441 Lumbago with sciatica, right side: Secondary | ICD-10-CM

## 2023-12-30 DIAGNOSIS — M5442 Lumbago with sciatica, left side: Secondary | ICD-10-CM | POA: Diagnosis not present

## 2023-12-30 MED ORDER — PREDNISONE 20 MG PO TABS: 40.0000 mg | ORAL_TABLET | Freq: Every day | ORAL | 0 refills | Status: AC

## 2023-12-30 MED ORDER — KETOROLAC TROMETHAMINE 30 MG/ML IJ SOLN
30.0000 mg | Freq: Once | INTRAMUSCULAR | Status: AC
  Administered 2023-12-30: 30 mg via INTRAMUSCULAR

## 2023-12-30 MED ORDER — LIDOCAINE 5 % EX PTCH: 1.0000 | MEDICATED_PATCH | CUTANEOUS | 0 refills | Status: AC

## 2023-12-30 MED ORDER — METHOCARBAMOL 500 MG PO TABS: 500.0000 mg | ORAL_TABLET | Freq: Two times a day (BID) | ORAL | 0 refills | Status: AC

## 2023-12-30 MED ORDER — DEXAMETHASONE SODIUM PHOSPHATE 10 MG/ML IJ SOLN
10.0000 mg | INTRAMUSCULAR | Status: AC
  Administered 2023-12-30: 10 mg via INTRAMUSCULAR

## 2023-12-30 NOTE — ED Triage Notes (Signed)
 Lower Back pain since yesterday

## 2023-12-30 NOTE — Discharge Instructions (Addendum)
 You were given injections of Toradol 30mg  and Decadron  10mg  today. Do not take any additional NSAIDs such as Aleve, Ibuprofen , Meloxicam, Naproxen or Advil  today. Recommend Tylenol  Arthritis Strength as needed for breakthrough pain or discomfort. Take medication as prescribed. Try to remain as active as possible. Gentle range of motion and stretching exercises to help with back spasm and pain.  I have provided exercises for you to perform at least 2-3 times daily. May apply ice or heat as needed.  Ice is recommended for pain or swelling, heat for spasm or stiffness.  Apply for 20 minutes, remove for 1 hour, then repeat. Recommend over-the-counter ibuprofen  starting on tomorrow and Tylenol  arthritis strength for pain or discomfort.  You may take the Tylenol  1 to 2 hours after the ibuprofen  for breakthrough pain or discomfort. Go to the emergency department immediately if you develop worsening pain, weakness in your legs or feet, become unable to walk, loss of bowel or bladder function, difficulty urinating or passing a bowel movement, or other concerns. If symptoms fail to improve with this treatment, it is recommended that you follow-up with orthopedics or with your primary care physician for further evaluation. Follow-up as needed.

## 2023-12-30 NOTE — ED Provider Notes (Signed)
 [facility]    CSN: [id number] Arrival date & time: 12/30/23  1017      History   Chief Complaint No chief complaint on file.   HPI Darren Finley is a 28 y.o. male.   The history is provided by the patient.   Patient presents with a 1 day history of acute onset recurrent lower back pain.  Patient states that he went to the gym yesterday, and came home and stretched.  He then developed pain in his mid lower back.  Patient states that it is now radiating down both of his legs.  Describes the pain as "burning."  States he has seen a chiropractor in the past but they were closed today.  States he has been taking meloxicam and ibuprofen  with minimal relief.  Also states that he did use heat for short time.   No fall, trauma, numbness or tingling, saddle paresthesia, changes to bowel or urinary habits, radicular symptoms.  Patient reports prior history of low back pain.  Past Medical History:  Diagnosis Date   Asthma     There are no active problems to display for this patient.   History reviewed. No pertinent surgical history.     Home Medications    Prior to Admission medications   Medication Sig Start Date End Date Taking? Authorizing Provider  acetaminophen  (TYLENOL ) 325 MG tablet Take 2 tablets (650 mg total) by mouth every 6 (six) hours as needed for moderate pain. 08/31/22   Adolph Hoop, PA-C  albuterol  (PROVENTIL ) (2.5 MG/3ML) 0.083% nebulizer solution Take 3 mLs (2.5 mg total) by nebulization every 6 (six) hours as needed for wheezing or shortness of breath. 07/04/23   Afton Albright, MD  albuterol  (VENTOLIN  HFA) 108 (90 Base) MCG/ACT inhaler INHALE 2 PUFFS BY MOUTH EVERY 4 HOURS AS NEEDED FOR WHEEZING OR SHORTNESS OF BREATH 07/04/23   Afton Albright, MD  budesonide -formoterol  (SYMBICORT ) 80-4.5 MCG/ACT inhaler Inhale 2 puffs into the lungs in the morning and at bedtime. 01/24/23   Raspet, Erin K, PA-C  cetirizine (ZYRTEC) 10 MG tablet Take 10 mg by mouth daily.     [provider]  fluticasone  (FLONASE ) 50 MCG/ACT nasal spray Place 2 sprays into both nostrils daily. Patient not taking: Reported on 05/24/2018 04/27/18 07/02/19  Martie Slaughter, PA-C  loratadine  (CLARITIN ) 10 MG tablet Take 1 tablet (10 mg total) by mouth daily. 04/17/19 05/31/20  Palumbo, April, MD    Family History History reviewed. No pertinent family history.  Social History Social History   Tobacco Use   Smoking status: Former    Types: Cigarettes   Smokeless tobacco: Never  Vaping Use   Vaping status: Every Day   Substances: Nicotine  Substance Use Topics   Alcohol use: Yes    Comment: occ   Drug use: Yes    Types: Marijuana     Allergies   Patient has no known allergies.   Review of Systems Review of Systems Per HPI  Physical Exam Triage Vital Signs ED Triage Vitals  Encounter Vitals Group     BP 12/30/23 1032 116/77     Systolic BP Percentile --      Diastolic BP Percentile --      Pulse Rate 12/30/23 1032 94     Resp 12/30/23 1032 18     Temp 12/30/23 1032 98.2 F (36.8 C)     Temp Source 12/30/23 1032 Oral     SpO2 12/30/23 1032 98 %     Weight --  Height --      Head Circumference --      Peak Flow --      Pain Score 12/30/23 1033 10     Pain Loc --      Pain Education --      Exclude from Growth Chart --    No data found.  Updated Vital Signs BP 116/77 (BP Location: Right Arm)   Pulse 94   Temp 98.2 F (36.8 C) (Oral)   Resp 18   SpO2 98%   Visual Acuity Right Eye Distance:   Left Eye Distance:   Bilateral Distance:    Right Eye Near:   Left Eye Near:    Bilateral Near:     Physical Exam Vitals and nursing note reviewed.  Constitutional:      General: He is not in acute distress.    Appearance: Normal appearance.  HENT:     Head: Normocephalic.  Eyes:     Extraocular Movements: Extraocular movements intact.     Pupils: Pupils are equal, round, and reactive to light.  Cardiovascular:     Rate and Rhythm:  Normal rate and regular rhythm.     Pulses: Normal pulses.     Heart sounds: Normal heart sounds.  Pulmonary:     Effort: Pulmonary effort is normal.     Breath sounds: Normal breath sounds.  Abdominal:     General: Bowel sounds are normal.     Palpations: Abdomen is soft.     Tenderness: There is no abdominal tenderness.  Musculoskeletal:     Cervical back: Normal range of motion.     Lumbar back: Spasms and tenderness present. No swelling, edema, deformity or signs of trauma. Decreased range of motion. Positive right straight leg raise test and positive left straight leg raise test.       Back:  Skin:    General: Skin is warm and dry.  Neurological:     General: No focal deficit present.     Mental Status: He is alert and oriented to person, place, and time.  Psychiatric:        Mood and Affect: Mood normal.        Behavior: Behavior normal.      UC Treatments / Results  Labs (all labs ordered are listed, but only abnormal results are displayed) Labs Reviewed - No data to display  EKG   Radiology No results found.  Procedures Procedures (including critical care time)  Medications Ordered in UC Medications - No data to display  Initial Impression / Assessment and Plan / UC Course  I have reviewed the triage vital signs and the nursing notes.  Pertinent labs & imaging results that were available during my care of the patient were reviewed by me and considered in my medical decision making (see chart for details).  Patient with recurrent low back pain. This is a chronic condition.  Pain radiates down into both lower extremities.  There is no obvious bruising, swelling, or redness in his back. Deferred imaging as it is of low yield for this particular problem.  Toradol 30 mg IM and Decadron  10 mg IM administered for pain.  Start methocarbamol  500 mg as needed for back pain and spasm.  Lidocaine  5% patch prescribed for back pain, and prednisone  40 mg for the next 5 days.   Supportive care recommendations were provided and discussed to include fluids, rest, and stretching exercises.  Patient was given strict ER follow-up precautions.  Patient advised  to follow-up with orthopedics if symptoms fail to improve with this treatment.  Patient was in agreement with this plan of care and verbalizes understanding.  All questions were answered.  Patient stable for discharge.  Work note was provided.   Final Clinical Impressions(s) / UC Diagnoses   Final diagnoses:  None   Discharge Instructions   None    ED Prescriptions   None    PDMP not reviewed this encounter.   Hardy Lia, NP 12/30/23 1054

## 2024-03-31 ENCOUNTER — Ambulatory Visit
Admission: RE | Admit: 2024-03-31 | Discharge: 2024-03-31 | Disposition: A | Discharge status: Home / Self Care | Source: Ambulatory Visit | Attending: Family Medicine

## 2024-03-31 ENCOUNTER — Ambulatory Visit (INDEPENDENT_AMBULATORY_CARE_PROVIDER_SITE_OTHER)

## 2024-03-31 VITALS — BP 121/79 | HR 81 | Temp 98.7°F | Resp 18

## 2024-03-31 DIAGNOSIS — R300 Dysuria: Secondary | ICD-10-CM | POA: Insufficient documentation

## 2024-03-31 DIAGNOSIS — M25531 Pain in right wrist: Secondary | ICD-10-CM | POA: Insufficient documentation

## 2024-03-31 DIAGNOSIS — M545 Low back pain, unspecified: Secondary | ICD-10-CM | POA: Diagnosis not present

## 2024-03-31 LAB — POCT URINE DIPSTICK
Bilirubin, UA: NEGATIVE
Blood, UA: NEGATIVE
Glucose, UA: NEGATIVE mg/dL
Nitrite, UA: NEGATIVE
Spec Grav, UA: 1.02 (ref 1.010–1.025)
Urobilinogen, UA: 0.2 U/dL
pH, UA: 6.5 (ref 5.0–8.0)

## 2024-03-31 MED ORDER — CYCLOBENZAPRINE HCL 5 MG PO TABS: 5.0000 mg | ORAL_TABLET | Freq: Every evening | ORAL | 0 refills | Status: AC | PRN

## 2024-03-31 MED ORDER — NAPROXEN 500 MG PO TABS: 500.0000 mg | ORAL_TABLET | Freq: Two times a day (BID) | ORAL | 0 refills | Status: AC

## 2024-03-31 NOTE — ED Triage Notes (Signed)
 Pt reports discomfort urinating on and off and right sided back pain x 1 month. States [place].   Pt reports right wrist pain x 6 weeks. Pain is worse when he used his hand to stan dup from bed.  Ibuprofen  gives no relief.

## 2024-03-31 NOTE — ED Provider Notes (Signed)
 [name]  Note:  This document was prepared using Conservation officer, historic buildings and may include unintentional dictation errors.  MRN: [id number] DOB: 1995-11-16  Subjective:   Darren Finley is a 28 y.o. male presenting for 1 month history of acute onset persistent and intermittent moderate right sided back pain, dysuria.  Patient has been traveling.  Reports that he has tried to hydrate consistently.  Has also had 6-week history of persistent intermittent right wrist pain.  Worse he applies pressure in a dorsiflexed position.  Ibuprofen  has not helped.  Low concern for sexually transmitted infection but is not opposed to testing.  Patient is concerned about urinary tract infection and would like testing for this.  No fall, trauma, numbness or tingling, saddle paresthesia, changes to bowel or urinary habits, radicular symptoms.  No history of renal stones.  No current facility-administered medications for this encounter.  Current Outpatient Medications:    ibuprofen  (ADVIL ) 200 MG tablet, Take 200 mg by mouth every 6 (six) hours as needed., Disp: , Rfl:    acetaminophen  (TYLENOL ) 325 MG tablet, Take 2 tablets (650 mg total) by mouth every 6 (six) hours as needed for moderate pain., Disp: 30 tablet, Rfl: 0   albuterol  (PROVENTIL ) (2.5 MG/3ML) 0.083% nebulizer solution, Take 3 mLs (2.5 mg total) by nebulization every 6 (six) hours as needed for wheezing or shortness of breath., Disp: 75 mL, Rfl: 1   albuterol  (VENTOLIN  HFA) 108 (90 Base) MCG/ACT inhaler, INHALE 2 PUFFS BY MOUTH EVERY 4 HOURS AS NEEDED FOR WHEEZING OR SHORTNESS OF BREATH, Disp: 26 each, Rfl: 5   budesonide -formoterol  (SYMBICORT ) 80-4.5 MCG/ACT inhaler, Inhale 2 puffs into the lungs in the morning and at bedtime., Disp: 1 each, Rfl: 1   cetirizine (ZYRTEC) 10 MG tablet, Take 10 mg by mouth daily., Disp: , Rfl:    lidocaine  (LIDODERM ) 5 %, Place 1 patch onto the skin daily., Disp: 30 patch, Rfl: 0    methocarbamol  (ROBAXIN ) 500 MG tablet, Take 1 tablet (500 mg total) by mouth 2 (two) times daily., Disp: 20 tablet, Rfl: 0   No Known Allergies  Past Medical History:  Diagnosis Date   Asthma      History reviewed. No pertinent surgical history.  History reviewed. No pertinent family history.  Social History   Tobacco Use   Smoking status: Former    Types: Cigarettes   Smokeless tobacco: Never  Vaping Use   Vaping status: Some Days   Substances: Nicotine  Substance Use Topics   Alcohol use: Not Currently    Comment: occ   Drug use: Yes    Types: Marijuana    Comment: Barely    ROS   Objective:   Vitals: BP 121/79 (BP Location: Right Arm)   Pulse 81   Temp 98.7 F (37.1 C) (Oral)   Resp 18   SpO2 98%   Physical Exam Constitutional:      General: He is not in acute distress.    Appearance: Normal appearance. He is well-developed and normal weight. He is not ill-appearing, toxic-appearing or diaphoretic.  HENT:     Head: Normocephalic and atraumatic.     Right Ear: External ear normal.     Left Ear: External ear normal.     Nose: Nose normal.     Mouth/Throat:     Pharynx: Oropharynx is clear.  Eyes:     General: No scleral icterus.       Right eye: No discharge.  Left eye: No discharge.     Extraocular Movements: Extraocular movements intact.  Cardiovascular:     Rate and Rhythm: Normal rate.  Pulmonary:     Effort: Pulmonary effort is normal.  Musculoskeletal:     Right wrist: Tenderness (by report only) present. No swelling, deformity, effusion, lacerations, bony tenderness, snuff box tenderness or crepitus. Normal range of motion.       Arms:     Cervical back: Normal range of motion.     Lumbar back: Spasms and tenderness (right sided) present. No swelling, edema, deformity, signs of trauma, lacerations or bony tenderness. Normal range of motion. Negative right straight leg raise test and negative left straight leg raise test. No scoliosis.      Comments: Negative Tinel's sign.  Neurological:     Mental Status: He is alert and oriented to person, place, and time.  Psychiatric:        Mood and Affect: Mood normal.        Behavior: Behavior normal.        Thought Content: Thought content normal.        Judgment: Judgment normal.     Results for orders placed or performed during the hospital encounter of 03/31/24 (from the past 24 hours)  POCT URINE DIPSTICK     Status: Abnormal   Collection Time: 03/31/24  5:34 PM  Result Value Ref Range   Color, UA yellow yellow   Clarity, UA hazy (A) clear   Glucose, UA negative negative mg/dL   Bilirubin, UA negative negative   Ketones, POC UA trace (5) (A) negative mg/dL   Spec Grav, UA 8.979 8.989 - 1.025   Blood, UA negative negative   pH, UA 6.5 5.0 - 8.0   POC PROTEIN,UA trace negative, trace   Urobilinogen, UA 0.2 0.2 or 1.0 E.U./dL   Nitrite, UA Negative Negative   Leukocytes, UA Trace (A) Negative   DG Wrist Complete Right Result Date: 03/31/2024 CLINICAL DATA:  Right wrist pain. EXAM: RIGHT WRIST - COMPLETE 3+ VIEW COMPARISON:  None Available. FINDINGS: No acute osseous or joint abnormality.  No degenerative changes. IMPRESSION: Negative. Electronically Signed   By: Newell Eke M.D.   On: 03/31/2024 17:51   Assessment and Plan :   PDMP not reviewed this encounter.  1. Dysuria   2. Right wrist pain   3. Acute right-sided low back pain without sciatica    Applied a 2 inch Ace wrap to the right wrist and hand.  Recommended conservative management with more consistent hydration, use naproxen  for pain and inflammation.  Can supplement treatment with muscle relaxant.  Urine culture, cytology pending.  Counseled patient on potential for adverse effects with medications prescribed/recommended today, ER and return-to-clinic precautions discussed, patient verbalized understanding.    Christopher Savannah, [place] 03/31/24 [phone]

## 2024-04-01 LAB — CYTOLOGY, (ORAL, ANAL, URETHRAL) ANCILLARY ONLY
Chlamydia: NEGATIVE
Comment: NEGATIVE
Comment: NEGATIVE
Comment: NORMAL
Neisseria Gonorrhea: NEGATIVE
Trichomonas: NEGATIVE

## 2024-04-01 LAB — URINE CULTURE: Culture: NO GROWTH

## 2024-04-02 ENCOUNTER — Ambulatory Visit (HOSPITAL_COMMUNITY): Payer: Self-pay

## 2024-05-12 ENCOUNTER — Ambulatory Visit
Admission: EM | Admit: 2024-05-12 | Discharge: 2024-05-12 | Disposition: A | Discharge status: Home / Self Care | Attending: Nurse Practitioner | Admitting: Nurse Practitioner

## 2024-05-12 DIAGNOSIS — J4531 Mild persistent asthma with (acute) exacerbation: Secondary | ICD-10-CM

## 2024-05-12 DIAGNOSIS — R059 Cough, unspecified: Secondary | ICD-10-CM

## 2024-05-12 LAB — POC SOFIA SARS ANTIGEN FIA: SARS Coronavirus 2 Ag: NEGATIVE

## 2024-05-12 MED ORDER — METHYLPREDNISOLONE SODIUM SUCC 125 MG IJ SOLR
125.0000 mg | Freq: Once | INTRAMUSCULAR | Status: AC
  Administered 2024-05-12: 125 mg via INTRAMUSCULAR

## 2024-05-12 MED ORDER — IPRATROPIUM-ALBUTEROL 0.5-2.5 (3) MG/3ML IN SOLN
3.0000 mL | Freq: Once | RESPIRATORY_TRACT | Status: AC
  Administered 2024-05-12: 3 mL via RESPIRATORY_TRACT

## 2024-05-12 MED ORDER — PREDNISONE 20 MG PO TABS: 40.0000 mg | ORAL_TABLET | Freq: Every day | ORAL | 0 refills | Status: AC

## 2024-05-12 MED ORDER — BUDESONIDE-FORMOTEROL FUMARATE 80-4.5 MCG/ACT IN AERO: 2.0000 | INHALATION_SPRAY | Freq: Two times a day (BID) | RESPIRATORY_TRACT | 0 refills | Status: AC

## 2024-05-12 MED ORDER — ALBUTEROL SULFATE (2.5 MG/3ML) 0.083% IN NEBU
2.5000 mg | INHALATION_SOLUTION | Freq: Four times a day (QID) | RESPIRATORY_TRACT | 0 refills | Status: DC | PRN
Start: 2024-05-12 — End: 2024-06-09

## 2024-05-12 MED ORDER — MONTELUKAST SODIUM 10 MG PO TABS: 10.0000 mg | ORAL_TABLET | Freq: Every day | ORAL | 0 refills | Status: AC

## 2024-05-12 NOTE — Discharge Instructions (Addendum)
 You were given an injection of Solu-Medrol  125 mg and a DuoNebulizer treatment.  Start the prednisone  tomorrow. Take medication as prescribed. Continue your current allergy regimen. Increase fluids and allow for plenty of rest. Recommend use of a humidifier in your bedroom at nighttime during sleep and sleeping elevated on pillows while symptoms persist. Try to avoid your allergy and asthma triggers. Go to the emergency department if you experience worsening shortness of breath, difficulty breathing, wheezing, or other concerns. Follow-up with your primary care physician within the next 7 to 10 days for reevaluation. Follow-up as needed.

## 2024-05-12 NOTE — ED Provider Notes (Signed)
 [facility]    CSN: [id number] Arrival date & time: 05/12/24  1826      History   Chief Complaint No chief complaint on file.   HPI Darren Finley is a 28 y.o. male.   The history is provided by the patient.   Patient presents with a several day history of wheezing, chest tightness, shortness of breath, and nasal congestion.  Patient denies fever, chills, headache, ear pain, chest pain, abdominal pain, nausea, vomiting, diarrhea, or rash.  Patient reports underlying history of seasonal allergies.  States that he has been taking his regular allergy medications with minimal relief.  Patient denies any obvious close sick contacts.  Past Medical History:  Diagnosis Date   Asthma     There are no active problems to display for this patient.   History reviewed. No pertinent surgical history.     Home Medications    Prior to Admission medications   Medication Sig Start Date End Date Taking? Authorizing Provider  albuterol  (VENTOLIN  HFA) 108 (90 Base) MCG/ACT inhaler INHALE 2 PUFFS BY MOUTH EVERY 4 HOURS AS NEEDED FOR WHEEZING OR SHORTNESS OF BREATH 07/04/23  Yes Hagler, Redell, MD  acetaminophen  (TYLENOL ) 325 MG tablet Take 2 tablets (650 mg total) by mouth every 6 (six) hours as needed for moderate pain. 08/31/22   Christopher Savannah, PA-C  albuterol  (PROVENTIL ) (2.5 MG/3ML) 0.083% nebulizer solution Take 3 mLs (2.5 mg total) by nebulization every 6 (six) hours as needed for wheezing or shortness of breath. 07/04/23   Rolinda Redell, MD  budesonide -formoterol  (SYMBICORT ) 80-4.5 MCG/ACT inhaler Inhale 2 puffs into the lungs in the morning and at bedtime. 01/24/23   Raspet, Erin K, PA-C  cetirizine (ZYRTEC) 10 MG tablet Take 10 mg by mouth daily.    [provider]  cyclobenzaprine  (FLEXERIL ) 5 MG tablet Take 1 tablet (5 mg total) by mouth at bedtime as needed. 03/31/24   Christopher Savannah, PA-C  ibuprofen  (ADVIL ) 200 MG tablet Take 200 mg by mouth every 6 (six) hours as needed.     [provider]  lidocaine  (LIDODERM ) 5 % Place 1 patch onto the skin daily. 12/30/23   Leath-Warren, Etta PARAS, NP  methocarbamol  (ROBAXIN ) 500 MG tablet Take 1 tablet (500 mg total) by mouth 2 (two) times daily. 12/30/23   Leath-Warren, Etta PARAS, NP  naproxen  (NAPROSYN ) 500 MG tablet Take 1 tablet (500 mg total) by mouth 2 (two) times daily with a meal. 03/31/24   Christopher Savannah, PA-C  fluticasone  (FLONASE ) 50 MCG/ACT nasal spray Place 2 sprays into both nostrils daily. Patient not taking: Reported on 05/24/2018 04/27/18 07/02/19  Law, Alexandra M, PA-C  loratadine  (CLARITIN ) 10 MG tablet Take 1 tablet (10 mg total) by mouth daily. 04/17/19 05/31/20  Palumbo, April, MD    Family History History reviewed. No pertinent family history.  Social History Social History   Tobacco Use   Smoking status: Former    Types: Cigarettes   Smokeless tobacco: Never  Vaping Use   Vaping status: Some Days   Substances: Nicotine  Substance Use Topics   Alcohol use: Not Currently    Comment: occ   Drug use: Yes    Types: Marijuana    Comment: Barely     Allergies   Patient has no known allergies.   Review of Systems Review of Systems Per HPI  Physical Exam Triage Vital Signs ED Triage Vitals  Encounter Vitals Group     BP 05/12/24 1840 124/86  Girls Systolic BP Percentile --      Girls Diastolic BP Percentile --      Boys Systolic BP Percentile --      Boys Diastolic BP Percentile --      Pulse Rate 05/12/24 1840 (!) 58     Resp 05/12/24 1840 17     Temp 05/12/24 1840 98.3 F (36.8 C)     Temp src --      SpO2 05/12/24 1840 93 %     Weight --      Height --      Head Circumference --      Peak Flow --      Pain Score 05/12/24 1839 0     Pain Loc --      Pain Education --      Exclude from Growth Chart --    No data found.  Updated Vital Signs BP 124/86   Pulse (!) 58   Temp 98.3 F (36.8 C)   Resp 17   SpO2 93%   Visual Acuity Right Eye Distance:   Left Eye  Distance:   Bilateral Distance:    Right Eye Near:   Left Eye Near:    Bilateral Near:     Physical Exam Vitals and nursing note reviewed.  Constitutional:      General: He is not in acute distress.    Appearance: Normal appearance.  HENT:     Head: Normocephalic.     Right Ear: Tympanic membrane, ear canal and external ear normal.     Left Ear: Tympanic membrane, ear canal and external ear normal.     Nose: Congestion present.     Mouth/Throat:     Mouth: Mucous membranes are moist.  Eyes:     Extraocular Movements: Extraocular movements intact.     Conjunctiva/sclera: Conjunctivae normal.     Pupils: Pupils are equal, round, and reactive to light.  Cardiovascular:     Rate and Rhythm: Regular rhythm. Bradycardia present.     Pulses: Normal pulses.     Heart sounds: Normal heart sounds.  Pulmonary:     Effort: Pulmonary effort is normal. No respiratory distress.     Breath sounds: No stridor. Wheezing (Expiratory wheezing noted throughout) present. No rhonchi or rales.  Chest:     Chest wall: No tenderness.  Abdominal:     General: Bowel sounds are normal.     Palpations: Abdomen is soft.     Tenderness: There is no abdominal tenderness.  Musculoskeletal:     Cervical back: Normal range of motion.  Skin:    General: Skin is warm and dry.  Neurological:     General: No focal deficit present.     Mental Status: He is alert and oriented to person, place, and time.  Psychiatric:        Mood and Affect: Mood normal.        Behavior: Behavior normal.      UC Treatments / Results  Labs (all labs ordered are listed, but only abnormal results are displayed) Labs Reviewed - No data to display  EKG   Radiology No results found.  Procedures Procedures (including critical care time)  Medications Ordered in UC Medications - No data to display  Initial Impression / Assessment and Plan / UC Course  I have reviewed the triage vital signs and the nursing  notes.  Pertinent labs & imaging results that were available during my care of the patient were reviewed by  me and considered in my medical decision making (see chart for details).  COVID test was negative.  On initial exam, patient with expiratory wheezing noted throughout.  Room air sats were 93% at that time.  DuoNeb and Solu-Medrol  125 mg IM were administered, post nebulizer treatment, patient with improvement of air movement, sats improved to 98%.  Symptoms are consistent and asthma exacerbation.  Will treat with prednisone  for 40 mg for the next 5 days, Singulair  10 mg, provide a refill of the patient's Symbicort , and provide albuterol  nebulizer solution.  Supportive care recommendations were provided discussed with the patient to include continuing his current allergy regimen, fluids, rest, and use of a humidifier.  Patient was given strict ER follow-up precautions.  Patient was in agreement with this plan of care and verbalizes understanding.  All questions were answered.  Patient stable for discharge.  Final Clinical Impressions(s) / UC Diagnoses   Final diagnoses:  None   Discharge Instructions   None    ED Prescriptions   None    PDMP not reviewed this encounter.   Gilmer Etta PARAS, NP 05/12/24 1925

## 2024-05-12 NOTE — ED Triage Notes (Signed)
 Pt present with c/o seasonal allergies. States the allergies have affected his asthma. States he has trouble sleeping at night, has trouble breathing and is experiencing chest tightness during the day.

## 2024-06-09 ENCOUNTER — Ambulatory Visit
Admission: EM | Admit: 2024-06-09 | Discharge: 2024-06-09 | Disposition: A | Discharge status: Home / Self Care | Attending: Nurse Practitioner | Admitting: Nurse Practitioner

## 2024-06-09 DIAGNOSIS — J4541 Moderate persistent asthma with (acute) exacerbation: Secondary | ICD-10-CM

## 2024-06-09 DIAGNOSIS — R059 Cough, unspecified: Secondary | ICD-10-CM

## 2024-06-09 LAB — POC SOFIA SARS ANTIGEN FIA: SARS Coronavirus 2 Ag: NEGATIVE

## 2024-06-09 MED ORDER — IPRATROPIUM-ALBUTEROL 0.5-2.5 (3) MG/3ML IN SOLN
3.0000 mL | Freq: Once | RESPIRATORY_TRACT | Status: AC
  Administered 2024-06-09: 3 mL via RESPIRATORY_TRACT

## 2024-06-09 MED ORDER — ALBUTEROL SULFATE HFA 108 (90 BASE) MCG/ACT IN AERS: 2.0000 | INHALATION_SPRAY | Freq: Four times a day (QID) | RESPIRATORY_TRACT | 0 refills | Status: DC | PRN

## 2024-06-09 MED ORDER — ALBUTEROL SULFATE (2.5 MG/3ML) 0.083% IN NEBU
2.5000 mg | INHALATION_SOLUTION | Freq: Once | RESPIRATORY_TRACT | Status: AC
  Administered 2024-06-09: 2.5 mg via RESPIRATORY_TRACT

## 2024-06-09 MED ORDER — PREDNISONE 50 MG PO TABS: ORAL_TABLET | ORAL | 0 refills | Status: AC

## 2024-06-09 MED ORDER — METHYLPREDNISOLONE SODIUM SUCC 125 MG IJ SOLR
125.0000 mg | Freq: Once | INTRAMUSCULAR | Status: AC
  Administered 2024-06-09: 125 mg via INTRAMUSCULAR

## 2024-06-09 NOTE — ED Provider Notes (Signed)
 [facility]    CSN: [id number] Arrival date & time: 06/09/24  0849      History   Chief Complaint Chief Complaint  Patient presents with   Cough   Wheezing    HPI Darren Finley is a 28 y.o. male.   The history is provided by the patient.   Patient presents for complaints of wheezing, cough, chest tightness, and shortness of breath.  Patient states symptoms have been present for the past 2 to 3 days.  He denies fever, chills, headache, ear pain, ear drainage, nasal congestion, abdominal pain, nausea, vomiting, diarrhea, or rash.  Patient reports underlying history of asthma.  States that he has been using his albuterol  inhaler with no relief.  Patient states that he does have a nebulizer machine at home, but has used his nebulizer treatments, only the inhalers.  His last exacerbation was treated in this office on 05/12/2024.  Patient states he has been taking Singulair  daily.  Patient states he is scheduled to see pulmonology, but that is not until the end of next month.  Past Medical History:  Diagnosis Date   Asthma     There are no active problems to display for this patient.   History reviewed. No pertinent surgical history.     Home Medications    Prior to Admission medications   Medication Sig Start Date End Date Taking? Authorizing Provider  budesonide -formoterol  (SYMBICORT ) 80-4.5 MCG/ACT inhaler Inhale 2 puffs into the lungs in the morning and at bedtime. 05/12/24  Yes Leath-Warren, Etta PARAS, NP  montelukast  (SINGULAIR ) 10 MG tablet Take 1 tablet (10 mg total) by mouth at bedtime. 05/12/24  Yes Leath-Warren, Etta PARAS, NP  WIXELA INHUB 250-50 MCG/ACT AEPB 2 (two) times daily.   Yes [provider]  acetaminophen  (TYLENOL ) 325 MG tablet Take 2 tablets (650 mg total) by mouth every 6 (six) hours as needed for moderate pain. 08/31/22   Christopher Savannah, PA-C  albuterol  (PROVENTIL ) (2.5 MG/3ML) 0.083% nebulizer solution Take 3 mLs (2.5 mg total) by  nebulization every 6 (six) hours as needed for wheezing or shortness of breath. 05/12/24   Leath-Warren, Etta PARAS, NP  cetirizine (ZYRTEC) 10 MG tablet Take 10 mg by mouth daily.    [provider]  cyclobenzaprine  (FLEXERIL ) 5 MG tablet Take 1 tablet (5 mg total) by mouth at bedtime as needed. 03/31/24   Christopher Savannah, PA-C  ibuprofen  (ADVIL ) 200 MG tablet Take 200 mg by mouth every 6 (six) hours as needed.    [provider]  lidocaine  (LIDODERM ) 5 % Place 1 patch onto the skin daily. 12/30/23   Leath-Warren, Etta PARAS, NP  methocarbamol  (ROBAXIN ) 500 MG tablet Take 1 tablet (500 mg total) by mouth 2 (two) times daily. 12/30/23   Leath-Warren, Etta PARAS, NP  naproxen  (NAPROSYN ) 500 MG tablet Take 1 tablet (500 mg total) by mouth 2 (two) times daily with a meal. 03/31/24   Christopher Savannah, PA-C  fluticasone  (FLONASE ) 50 MCG/ACT nasal spray Place 2 sprays into both nostrils daily. Patient not taking: Reported on 05/24/2018 04/27/18 07/02/19  Law, Alexandra M, PA-C  loratadine  (CLARITIN ) 10 MG tablet Take 1 tablet (10 mg total) by mouth daily. 04/17/19 05/31/20  Palumbo, April, MD    Family History History reviewed. No pertinent family history.  Social History Social History   Tobacco Use   Smoking status: Former    Types: Cigarettes   Smokeless tobacco: Never  Vaping Use   Vaping status: Some Days  Substances: Nicotine  Substance Use Topics   Alcohol use: Not Currently    Comment: occ   Drug use: Yes    Types: Marijuana    Comment: Barely     Allergies   Patient has no known allergies.   Review of Systems Review of Systems Per HPI  Physical Exam Triage Vital Signs ED Triage Vitals  Encounter Vitals Group     BP 06/09/24 0938 110/74     Girls Systolic BP Percentile --      Girls Diastolic BP Percentile --      Boys Systolic BP Percentile --      Boys Diastolic BP Percentile --      Pulse Rate 06/09/24 0938 84     Resp 06/09/24 0938 18     Temp 06/09/24 0938  98.4 F (36.9 C)     Temp Source 06/09/24 0938 Oral     SpO2 06/09/24 0938 95 %     Weight --      Height --      Head Circumference --      Peak Flow --      Pain Score 06/09/24 0942 8     Pain Loc --      Pain Education --      Exclude from Growth Chart --    No data found.  Updated Vital Signs BP 110/74 (BP Location: Right Arm)   Pulse 84   Temp 98.4 F (36.9 C) (Oral)   Resp 18   SpO2 95%   Visual Acuity Right Eye Distance:   Left Eye Distance:   Bilateral Distance:    Right Eye Near:   Left Eye Near:    Bilateral Near:     Physical Exam Vitals and nursing note reviewed.  Constitutional:      General: He is not in acute distress.    Appearance: Normal appearance.     Comments: Patient is speaking in complete sentences, is in no acute distress.  HENT:     Head: Normocephalic.     Right Ear: Tympanic membrane, ear canal and external ear normal.     Left Ear: Tympanic membrane, ear canal and external ear normal.     Nose: Congestion present.     Mouth/Throat:     Mouth: Mucous membranes are moist.  Eyes:     Extraocular Movements: Extraocular movements intact.     Conjunctiva/sclera: Conjunctivae normal.     Pupils: Pupils are equal, round, and reactive to light.  Pulmonary:     Effort: Pulmonary effort is normal. Prolonged expiration present.     Breath sounds: Decreased air movement present. No stridor. Examination of the right-upper field reveals wheezing. Examination of the left-upper field reveals wheezing. Examination of the right-lower field reveals wheezing. Examination of the left-lower field reveals wheezing. Wheezing present. No rhonchi or rales.  Chest:     Chest wall: No tenderness.  Abdominal:     General: Bowel sounds are normal.     Palpations: Abdomen is soft.  Neurological:     Mental Status: He is alert.      UC Treatments / Results  Labs (all labs ordered are listed, but only abnormal results are displayed) Labs Reviewed  POC  SOFIA SARS ANTIGEN FIA    EKG   Radiology No results found.  Procedures Procedures (including critical care time)  Medications Ordered in UC Medications  ipratropium-albuterol  (DUONEB) 0.5-2.5 (3) MG/3ML nebulizer solution 3 mL (has no administration in time range)  methylPREDNISolone  sodium succinate (SOLU-MEDROL ) 125 mg/2 mL injection 125 mg (has no administration in time range)    Initial Impression / Assessment and Plan / UC Course  I have reviewed the triage vital signs and the nursing notes.  Pertinent labs & imaging results that were available during my care of the patient were reviewed by me and considered in my medical decision making (see chart for details).  COVID test was negative.  On initial exam, patient with expiratory wheezing noted throughout.  Room air sats were 95% at that time.  DuoNeb, albuterol  neb and Solu-Medrol  125 mg IM were administered, post duo nebulizer treatment, sats improved to 99%, but patient continued to have decreased air movement.  Second albuterol  nebulizer treatment was administered.  Symptoms are consistent and asthma exacerbation.  Will treat with prednisone  for 50 mg for the next 5 days, and provide refill for the patient's albuterol  inhaler.  Patient advised to follow-up with pulmonology as scheduled.  Supportive care recommendations were provided discussed with the patient to include continuing his current allergy regimen, fluids, rest, and use of a humidifier.  Patient was given strict ER follow-up precautions.  Patient was in agreement with this plan of care and verbalizes understanding.  All questions were answered.  Patient stable for discharge.   Final Clinical Impressions(s) / UC Diagnoses   Final diagnoses:  Cough, unspecified type   Discharge Instructions   None    ED Prescriptions   None    PDMP not reviewed this encounter.   Gilmer Etta PARAS, NP 06/09/24 1112

## 2024-06-09 NOTE — Discharge Instructions (Signed)
 The COVID test was negative. You were given an injection of Solu-Medrol  125 mg and a DuoNebulizer treatment. Start the prednisone  tomorrow. Take medication as prescribed. Continue your current allergy regimen. Increase fluids and allow for plenty of rest. Recommend use of a humidifier in your bedroom at nighttime during sleep and sleeping elevated on pillows while symptoms persist. Try to avoid your allergy and asthma triggers. Go to the emergency department if you experience worsening shortness of breath, difficulty breathing, wheezing, or other concerns. Follow-up with pulmonology as scheduled. Follow-up as needed.

## 2024-06-09 NOTE — ED Triage Notes (Signed)
 Sneezing, cough x 4 days, sob with exertion, pt states he feels like he is having a asthma flare up, pt states he is having wheezing and chest tightness x 2-3 days. Taking albuterol  inhaler with no relief of symptoms.

## 2024-06-21 ENCOUNTER — Ambulatory Visit (INDEPENDENT_AMBULATORY_CARE_PROVIDER_SITE_OTHER)

## 2024-06-21 ENCOUNTER — Ambulatory Visit
Admission: EM | Admit: 2024-06-21 | Discharge: 2024-06-21 | Disposition: A | Discharge status: Home / Self Care | Attending: Family Medicine | Admitting: Family Medicine

## 2024-06-21 DIAGNOSIS — J4541 Moderate persistent asthma with (acute) exacerbation: Secondary | ICD-10-CM

## 2024-06-21 DIAGNOSIS — R051 Acute cough: Secondary | ICD-10-CM

## 2024-06-21 DIAGNOSIS — J3089 Other allergic rhinitis: Secondary | ICD-10-CM | POA: Diagnosis not present

## 2024-06-21 MED ORDER — PREDNISONE 20 MG PO TABS: 40.0000 mg | ORAL_TABLET | Freq: Every day | ORAL | 0 refills | Status: AC

## 2024-06-21 MED ORDER — ALBUTEROL SULFATE (2.5 MG/3ML) 0.083% IN NEBU
2.5000 mg | INHALATION_SOLUTION | Freq: Once | RESPIRATORY_TRACT | Status: AC
  Administered 2024-06-21: 2.5 mg via RESPIRATORY_TRACT

## 2024-06-21 MED ORDER — AZELASTINE HCL 0.1 % NA SOLN: 1.0000 | Freq: Two times a day (BID) | NASAL | 0 refills | Status: AC

## 2024-06-21 MED ORDER — ALBUTEROL SULFATE HFA 108 (90 BASE) MCG/ACT IN AERS: 2.0000 | INHALATION_SPRAY | RESPIRATORY_TRACT | 0 refills | Status: AC | PRN

## 2024-06-21 MED ORDER — FLUTICASONE-SALMETEROL 230-21 MCG/ACT IN AERO: 2.0000 | INHALATION_SPRAY | Freq: Two times a day (BID) | RESPIRATORY_TRACT | 0 refills | Status: AC

## 2024-06-21 MED ORDER — DEXAMETHASONE SOD PHOSPHATE PF 10 MG/ML IJ SOLN
10.0000 mg | Freq: Once | INTRAMUSCULAR | Status: AC
  Administered 2024-06-21: 10 mg via INTRAMUSCULAR

## 2024-06-21 NOTE — Discharge Instructions (Signed)
 Will give you a call if anything comes back acutely abnormal on your chest x-ray.  We have given you another steroid shot today as well as prescribed prednisone , a new maintenance inhaler to see if this is better covered, Astelin  nasal spray and a new rescue inhaler in case you run out of your current 1.  Give us  a call if the pharmacy is able to advise if any maintenance inhalers will be cheaper for you.  Continue the montelukast  and Zyrtec daily.

## 2024-06-21 NOTE — ED Provider Notes (Signed)
 [facility]    CSN: [id number] Arrival date & time: 06/21/24  0805      History   Chief Complaint No chief complaint on file.   HPI Darren Finley is a 28 y.o. male.   Presenting today with 2-week history of waxing and waning wheezing, chest tightness, cough, shortness of breath.  Was seen on 06/09/2024, given a steroid shot, prednisone , DuoNebs and albuterol  inhaler which he states helped for several days but his symptoms have returned and worsened.  He is supposed to be on Wixela for maintenance inhaler for his asthma but notes that he has not taken it in weeks.  He used to be on Symbicort  which did well for him but insurance stopped covering it.  Denies fever, chills, chest pain, abdominal pain, vomiting, diarrhea.    Past Medical History:  Diagnosis Date   Asthma     There are no active problems to display for this patient.   History reviewed. No pertinent surgical history.     Home Medications    Prior to Admission medications   Medication Sig Start Date End Date Taking? Authorizing Provider  azelastine (ASTELIN) 0.1 % nasal spray Place 1 spray into both nostrils 2 (two) times daily. Use in each nostril as directed 06/21/24  Yes Stuart Vernell Norris, PA-C  fluticasone -salmeterol (ADVAIR  HFA) 230-21 MCG/ACT inhaler Inhale 2 puffs into the lungs 2 (two) times daily. Rinse mouth with water after each use 06/21/24  Yes Stuart Vernell Norris, PA-C  predniSONE  (DELTASONE ) 20 MG tablet Take 2 tablets (40 mg total) by mouth daily with breakfast. 06/21/24  Yes Stuart Vernell Norris, PA-C  acetaminophen  (TYLENOL ) 325 MG tablet Take 2 tablets (650 mg total) by mouth every 6 (six) hours as needed for moderate pain. 08/31/22   Christopher Savannah, PA-C  albuterol  (VENTOLIN  HFA) 108 (90 Base) MCG/ACT inhaler Inhale 2 puffs into the lungs every 4 (four) hours as needed. 06/21/24   Stuart Vernell Norris, PA-C  budesonide -formoterol  (SYMBICORT ) 80-4.5 MCG/ACT inhaler Inhale 2  puffs into the lungs in the morning and at bedtime. 05/12/24   Leath-Warren, Etta PARAS, NP  cetirizine (ZYRTEC) 10 MG tablet Take 10 mg by mouth daily.    [provider]  cyclobenzaprine  (FLEXERIL ) 5 MG tablet Take 1 tablet (5 mg total) by mouth at bedtime as needed. 03/31/24   Christopher Savannah, PA-C  ibuprofen  (ADVIL ) 200 MG tablet Take 200 mg by mouth every 6 (six) hours as needed.    [provider]  lidocaine  (LIDODERM ) 5 % Place 1 patch onto the skin daily. 12/30/23   Leath-Warren, Etta PARAS, NP  methocarbamol  (ROBAXIN ) 500 MG tablet Take 1 tablet (500 mg total) by mouth 2 (two) times daily. 12/30/23   Leath-Warren, Etta PARAS, NP  montelukast  (SINGULAIR ) 10 MG tablet Take 1 tablet (10 mg total) by mouth at bedtime. 05/12/24   Leath-Warren, Etta PARAS, NP  naproxen  (NAPROSYN ) 500 MG tablet Take 1 tablet (500 mg total) by mouth 2 (two) times daily with a meal. 03/31/24   Christopher Savannah, PA-C  predniSONE  (DELTASONE ) 50 MG tablet Take one tablet daily with breakfast for 5 days. 06/09/24   Leath-Warren, Etta PARAS, NP  WIXELA INHUB 250-50 MCG/ACT AEPB 2 (two) times daily.    [provider]  fluticasone  (FLONASE ) 50 MCG/ACT nasal spray Place 2 sprays into both nostrils daily. Patient not taking: Reported on 05/24/2018 04/27/18 07/02/19  Rendell Lorane HERO, PA-C  loratadine  (CLARITIN ) 10 MG tablet Take 1 tablet (10 mg total) by  mouth daily. 04/17/19 05/31/20  Palumbo, April, MD    Family History History reviewed. No pertinent family history.  Social History Social History   Tobacco Use   Smoking status: Former    Types: Cigarettes   Smokeless tobacco: Never  Vaping Use   Vaping status: Some Days   Substances: Nicotine  Substance Use Topics   Alcohol use: Not Currently    Comment: occ   Drug use: Yes    Types: Marijuana    Comment: Barely     Allergies   Patient has no known allergies.   Review of Systems Review of Systems Per HPI  Physical Exam Triage Vital  Signs ED Triage Vitals  Encounter Vitals Group     BP 06/21/24 0812 127/86     Girls Systolic BP Percentile --      Girls Diastolic BP Percentile --      Boys Systolic BP Percentile --      Boys Diastolic BP Percentile --      Pulse Rate 06/21/24 0812 70     Resp 06/21/24 0812 20     Temp 06/21/24 0812 98.8 F (37.1 C)     Temp Source 06/21/24 0812 Oral     SpO2 06/21/24 0812 94 %     Weight --      Height --      Head Circumference --      Peak Flow --      Pain Score 06/21/24 0818 8     Pain Loc --      Pain Education --      Exclude from Growth Chart --    No data found.  Updated Vital Signs BP 127/86 (BP Location: Right Arm)   Pulse 70   Temp 98.8 F (37.1 C) (Oral)   Resp 20   SpO2 94%   Visual Acuity Right Eye Distance:   Left Eye Distance:   Bilateral Distance:    Right Eye Near:   Left Eye Near:    Bilateral Near:     Physical Exam Vitals and nursing note reviewed.  Constitutional:      Appearance: He is well-developed.  HENT:     Head: Atraumatic.     Right Ear: External ear normal.     Left Ear: External ear normal.     Nose: Rhinorrhea present.     Mouth/Throat:     Mouth: Mucous membranes are moist.     Pharynx: Oropharynx is clear. No oropharyngeal exudate or posterior oropharyngeal erythema.  Eyes:     Conjunctiva/sclera: Conjunctivae normal.     Pupils: Pupils are equal, round, and reactive to light.  Cardiovascular:     Rate and Rhythm: Normal rate and regular rhythm.  Pulmonary:     Effort: Pulmonary effort is normal. No respiratory distress.     Breath sounds: Wheezing present. No rales.  Musculoskeletal:        General: Normal range of motion.     Cervical back: Normal range of motion and neck supple.  Lymphadenopathy:     Cervical: No cervical adenopathy.  Skin:    General: Skin is warm and dry.  Neurological:     Mental Status: He is alert and oriented to person, place, and time.  Psychiatric:        Behavior: Behavior  normal.      UC Treatments / Results  Labs (all labs ordered are listed, but only abnormal results are displayed) Labs Reviewed - No data to display  EKG   Radiology DG Chest 2 View Result Date: 06/21/2024 CLINICAL DATA:  Progressive cough, chest tightness and shortness of breath EXAM: CHEST - 2 VIEW COMPARISON:  None Available. FINDINGS: The lungs are clear and negative for focal airspace consolidation, pulmonary edema or suspicious pulmonary nodule. No pleural effusion or pneumothorax. Cardiac and mediastinal contours are within normal limits. No acute fracture or lytic or blastic osseous lesions. The visualized upper abdominal bowel gas pattern is unremarkable. IMPRESSION: Normal chest x-ray. Electronically Signed   By: Wilkie Lent M.D.   On: 06/21/2024 09:30    Procedures Procedures (including critical care time)  Medications Ordered in UC Medications  dexamethasone  (DECADRON ) injection 10 mg (10 mg Intramuscular Given 06/21/24 0921)  albuterol  (PROVENTIL ) (2.5 MG/3ML) 0.083% nebulizer solution 2.5 mg (2.5 mg Nebulization Given 06/21/24 0921)    Initial Impression / Assessment and Plan / UC Course  I have reviewed the triage vital signs and the nursing notes.  Pertinent labs & imaging results that were available during my care of the patient were reviewed by me and considered in my medical decision making (see chart for details).     Vital signs within normal limits, he is overall well-appearing and in no acute distress.  Chest x-ray negative for acute cardiopulmonary abnormality.  Consistent with significant asthma exacerbation.  Albuterol  neb treatment given in clinic, IM Decadron  administered and will start Advair  maintenance inhaler, refill albuterol  for as needed use, Astelin  nasal spray, short course of prednisone .  Continue allergy regimen, supportive over-the-counter medications and home care.  Return for worsening or unresolving symptoms.  Final Clinical  Impressions(s) / UC Diagnoses   Final diagnoses:  Acute cough  Moderate persistent asthma with acute exacerbation  Seasonal allergic rhinitis due to other allergic trigger     Discharge Instructions      Will give you a call if anything comes back acutely abnormal on your chest x-ray.  We have given you another steroid shot today as well as prescribed prednisone , a new maintenance inhaler to see if this is better covered, Astelin  nasal spray and a new rescue inhaler in case you run out of your current 1.  Give us  a call if the pharmacy is able to advise if any maintenance inhalers will be cheaper for you.  Continue the montelukast  and Zyrtec daily.    ED Prescriptions     Medication Sig Dispense Auth. Provider   azelastine  (ASTELIN ) 0.1 % nasal spray Place 1 spray into both nostrils 2 (two) times daily. Use in each nostril as directed 30 mL Stuart Vernell Norris, PA-C   fluticasone -salmeterol (ADVAIR  HFA) 230-21 MCG/ACT inhaler Inhale 2 puffs into the lungs 2 (two) times daily. Rinse mouth with water after each use 1 each Stuart Vernell Norris, PA-C   predniSONE  (DELTASONE ) 20 MG tablet Take 2 tablets (40 mg total) by mouth daily with breakfast. 10 tablet Stuart Vernell Norris, PA-C   albuterol  (VENTOLIN  HFA) 108 (90 Base) MCG/ACT inhaler Inhale 2 puffs into the lungs every 4 (four) hours as needed. 18 g Stuart Vernell Norris, [place]      PDMP not reviewed this encounter.   Stuart Vernell Norris, [place] 06/21/24 1414

## 2024-06-21 NOTE — ED Triage Notes (Signed)
 Pt reports wheezing chest tightness and SOB, started on 06/06/2024 pt was seen on 06/09/2024 for same sx's and treated in clinic with duo-neb, albuterol  neb, and solumedrol  injection. Pt was sent home with prednisone  taper and albuterol  inhaler. Pt states that these measures helped for about 3-4 days and then his sx's returned shortly after completing the steroids.
